# Patient Record
Sex: Female | Born: 1945 | Race: White | Hispanic: No | Smoking: Former smoker
Health system: Southern US, Community
[De-identification: ages and names within clinical notes are randomized; demographics above are authoritative.]

## PROBLEM LIST (undated history)

## (undated) DIAGNOSIS — G4700 Insomnia, unspecified: Secondary | ICD-10-CM

## (undated) DIAGNOSIS — K219 Gastro-esophageal reflux disease without esophagitis: Secondary | ICD-10-CM

## (undated) DIAGNOSIS — N904 Leukoplakia of vulva: Secondary | ICD-10-CM

## (undated) DIAGNOSIS — T7840XA Allergy, unspecified, initial encounter: Secondary | ICD-10-CM

## (undated) DIAGNOSIS — K649 Unspecified hemorrhoids: Secondary | ICD-10-CM

## (undated) HISTORY — DX: Unspecified hemorrhoids: K64.9

## (undated) HISTORY — DX: Leukoplakia of vulva: N90.4

## (undated) HISTORY — DX: Allergy, unspecified, initial encounter: T78.40XA

## (undated) HISTORY — DX: Gastro-esophageal reflux disease without esophagitis: K21.9

## (undated) HISTORY — DX: Insomnia, unspecified: G47.00

---

## 1998-01-03 ENCOUNTER — Other Ambulatory Visit: Admission: RE | Admit: 1998-01-03 | Discharge: 1998-01-03 | Payer: Self-pay | Admitting: Obstetrics and Gynecology

## 1999-06-26 ENCOUNTER — Other Ambulatory Visit: Admission: RE | Admit: 1999-06-26 | Discharge: 1999-06-26 | Payer: Self-pay | Admitting: Obstetrics and Gynecology

## 1999-07-25 ENCOUNTER — Encounter: Payer: Self-pay | Admitting: Obstetrics and Gynecology

## 1999-07-25 ENCOUNTER — Encounter: Admission: RE | Admit: 1999-07-25 | Discharge: 1999-07-25 | Payer: Self-pay | Admitting: Obstetrics and Gynecology

## 2000-06-26 ENCOUNTER — Other Ambulatory Visit: Admission: RE | Admit: 2000-06-26 | Discharge: 2000-06-26 | Payer: Self-pay | Admitting: Obstetrics and Gynecology

## 2000-07-26 ENCOUNTER — Encounter: Payer: Self-pay | Admitting: Obstetrics and Gynecology

## 2000-07-26 ENCOUNTER — Encounter: Admission: RE | Admit: 2000-07-26 | Discharge: 2000-07-26 | Payer: Self-pay | Admitting: Obstetrics and Gynecology

## 2000-10-16 ENCOUNTER — Ambulatory Visit (HOSPITAL_BASED_OUTPATIENT_CLINIC_OR_DEPARTMENT_OTHER): Admission: RE | Admit: 2000-10-16 | Discharge: 2000-10-16 | Payer: Self-pay | Admitting: Orthopedic Surgery

## 2001-10-15 ENCOUNTER — Other Ambulatory Visit: Admission: RE | Admit: 2001-10-15 | Discharge: 2001-10-15 | Payer: Self-pay | Admitting: Obstetrics and Gynecology

## 2001-11-26 ENCOUNTER — Encounter: Admission: RE | Admit: 2001-11-26 | Discharge: 2001-11-26 | Payer: Self-pay | Admitting: Obstetrics and Gynecology

## 2001-11-26 ENCOUNTER — Encounter: Payer: Self-pay | Admitting: Obstetrics and Gynecology

## 2002-04-21 ENCOUNTER — Encounter: Payer: Self-pay | Admitting: Gastroenterology

## 2002-04-21 ENCOUNTER — Encounter: Admission: RE | Admit: 2002-04-21 | Discharge: 2002-04-21 | Payer: Self-pay | Admitting: Gastroenterology

## 2002-11-30 ENCOUNTER — Other Ambulatory Visit: Admission: RE | Admit: 2002-11-30 | Discharge: 2002-11-30 | Payer: Self-pay | Admitting: Obstetrics and Gynecology

## 2002-12-01 ENCOUNTER — Encounter: Payer: Self-pay | Admitting: Obstetrics and Gynecology

## 2002-12-01 ENCOUNTER — Encounter: Admission: RE | Admit: 2002-12-01 | Discharge: 2002-12-01 | Payer: Self-pay | Admitting: Obstetrics and Gynecology

## 2003-12-07 ENCOUNTER — Ambulatory Visit (HOSPITAL_COMMUNITY): Admission: RE | Admit: 2003-12-07 | Discharge: 2003-12-07 | Payer: Self-pay | Admitting: Obstetrics and Gynecology

## 2004-12-18 ENCOUNTER — Ambulatory Visit (HOSPITAL_COMMUNITY): Admission: RE | Admit: 2004-12-18 | Discharge: 2004-12-18 | Payer: Self-pay | Admitting: Obstetrics and Gynecology

## 2005-12-19 ENCOUNTER — Ambulatory Visit (HOSPITAL_COMMUNITY): Admission: RE | Admit: 2005-12-19 | Discharge: 2005-12-19 | Payer: Self-pay | Admitting: Obstetrics and Gynecology

## 2006-12-23 ENCOUNTER — Ambulatory Visit (HOSPITAL_COMMUNITY): Admission: RE | Admit: 2006-12-23 | Discharge: 2006-12-23 | Payer: Self-pay | Admitting: Obstetrics and Gynecology

## 2008-01-01 ENCOUNTER — Ambulatory Visit (HOSPITAL_COMMUNITY): Admission: RE | Admit: 2008-01-01 | Discharge: 2008-01-01 | Payer: Self-pay | Admitting: Obstetrics and Gynecology

## 2009-01-04 ENCOUNTER — Ambulatory Visit (HOSPITAL_COMMUNITY): Admission: RE | Admit: 2009-01-04 | Discharge: 2009-01-04 | Payer: Self-pay | Admitting: Obstetrics and Gynecology

## 2010-01-06 ENCOUNTER — Ambulatory Visit (HOSPITAL_COMMUNITY): Admission: RE | Admit: 2010-01-06 | Discharge: 2010-01-06 | Payer: Self-pay | Admitting: Obstetrics and Gynecology

## 2010-01-11 ENCOUNTER — Encounter: Admission: RE | Admit: 2010-01-11 | Discharge: 2010-01-11 | Payer: Self-pay | Admitting: Obstetrics and Gynecology

## 2010-03-06 ENCOUNTER — Encounter: Admission: RE | Admit: 2010-03-06 | Discharge: 2010-03-06 | Payer: Self-pay | Admitting: Family Medicine

## 2010-07-02 ENCOUNTER — Encounter: Payer: Self-pay | Admitting: Obstetrics and Gynecology

## 2010-10-27 NOTE — Op Note (Signed)
[facility]  Patient:    Brandy Leonard, Brandy Leonard                      MRN: [id number] Proc. Date: 10/16/00 Adm. Date:  [id number] Attending:  Marlowe Shores                           Operative Report  PREOPERATIVE DIAGNOSIS:  Right thumb stenosing tenosynovitis.  POSTOPERATIVE DIAGNOSIS:  Right thumb stenosing tenosynovitis.  OPERATION:  Release of A1 pulley, right thumb.  SURGEON:  Artist Pais. Mina Marble, M.D.  ASSISTANT:  Junius Roads. Ireton, P.A.C.  ANESTHESIA:  General  TOURNIQUET TIME:  18 minutes  COMPLICATIONS:  None.  DRAINS: None.  DESCRIPTION OF PROCEDURE:  The patient was taken to the operating room and after the induction of adequate general anesthesia, the right upper extremity was prepped and draped in the usual sterile fashion.  An Esmarch was used to exsanguinate the limb.  The tourniquet was then inflated to 250 mmHg.  At this point in time, a 2 cm incision was made in the palmar aspect of the thumb in the MP flexion crease.  The incision was taken down through the skin and subcutaneous tissues with care to carefully identify and retract both the radial and ulnar neurovascular bundles.  Once this was done, the proximal edge of the A1 pulley was identified.  This was split with a tenotomy scissors thus exposing the underlying FPL tendon. The FPL tendon was then lysed of all adhesions and the patient had full passive range of motion with no after triggering noted.  The wound was then thoroughly irrigated and closed with 5-0 nylon in horizontal mattress sutures.  A sterile dressing of Xeroform, 4 x 4s, fluffs and compressive dressing was applied.  The patient also had 0.25% plain Marcaine injected for postoperative pain control. DD:  10/16/00 TD:  10/16/00 Job: [id number]

## 2010-12-20 ENCOUNTER — Other Ambulatory Visit (HOSPITAL_COMMUNITY): Payer: Self-pay | Admitting: Obstetrics and Gynecology

## 2010-12-20 DIAGNOSIS — Z1231 Encounter for screening mammogram for malignant neoplasm of breast: Secondary | ICD-10-CM

## 2011-01-16 ENCOUNTER — Ambulatory Visit (HOSPITAL_COMMUNITY)
Admission: RE | Admit: 2011-01-16 | Discharge: 2011-01-16 | Disposition: A | Discharge status: Home / Self Care | Payer: BC Managed Care – PPO | Source: Ambulatory Visit | Attending: Obstetrics and Gynecology | Admitting: Obstetrics and Gynecology

## 2011-01-16 DIAGNOSIS — Z1231 Encounter for screening mammogram for malignant neoplasm of breast: Secondary | ICD-10-CM | POA: Insufficient documentation

## 2011-05-24 ENCOUNTER — Encounter (INDEPENDENT_AMBULATORY_CARE_PROVIDER_SITE_OTHER): Payer: BC Managed Care – PPO | Admitting: Family Medicine

## 2011-05-24 DIAGNOSIS — D509 Iron deficiency anemia, unspecified: Secondary | ICD-10-CM

## 2011-05-24 DIAGNOSIS — Z23 Encounter for immunization: Secondary | ICD-10-CM

## 2011-05-24 DIAGNOSIS — Z0000 Encounter for general adult medical examination without abnormal findings: Secondary | ICD-10-CM

## 2011-07-02 ENCOUNTER — Other Ambulatory Visit (HOSPITAL_COMMUNITY): Payer: Self-pay | Admitting: Obstetrics and Gynecology

## 2011-07-02 DIAGNOSIS — M8580 Other specified disorders of bone density and structure, unspecified site: Secondary | ICD-10-CM

## 2011-07-04 ENCOUNTER — Ambulatory Visit (HOSPITAL_COMMUNITY): Payer: BC Managed Care – PPO

## 2011-07-10 ENCOUNTER — Ambulatory Visit (HOSPITAL_COMMUNITY)
Admission: RE | Admit: 2011-07-10 | Discharge: 2011-07-10 | Disposition: A | Discharge status: Home / Self Care | Payer: BC Managed Care – PPO | Source: Ambulatory Visit | Attending: Obstetrics and Gynecology | Admitting: Obstetrics and Gynecology

## 2011-07-10 DIAGNOSIS — M8580 Other specified disorders of bone density and structure, unspecified site: Secondary | ICD-10-CM

## 2011-07-10 DIAGNOSIS — Z13820 Encounter for screening for osteoporosis: Secondary | ICD-10-CM | POA: Insufficient documentation

## 2011-07-10 DIAGNOSIS — Z780 Asymptomatic menopausal state: Secondary | ICD-10-CM | POA: Insufficient documentation

## 2011-07-19 ENCOUNTER — Ambulatory Visit (INDEPENDENT_AMBULATORY_CARE_PROVIDER_SITE_OTHER): Payer: BC Managed Care – PPO | Admitting: Internal Medicine

## 2011-07-19 VITALS — BP 108/72 | HR 80 | Temp 98.3°F | Resp 16 | Ht 64.5 in | Wt 158.0 lb

## 2011-07-19 DIAGNOSIS — R300 Dysuria: Secondary | ICD-10-CM

## 2011-07-19 LAB — POCT UA - MICROSCOPIC ONLY
Bacteria, U Microscopic: NEGATIVE
Casts, Ur, LPF, POC: NEGATIVE
Crystals, Ur, HPF, POC: NEGATIVE
Mucus, UA: POSITIVE
Yeast, UA: NEGATIVE

## 2011-07-19 LAB — POCT URINALYSIS DIPSTICK
Bilirubin, UA: NEGATIVE
Blood, UA: NEGATIVE
Glucose, UA: NEGATIVE
Leukocytes, UA: NEGATIVE
Nitrite, UA: NEGATIVE
Protein, UA: NEGATIVE
Spec Grav, UA: 1.025
Urobilinogen, UA: 0.2
pH, UA: 6.5

## 2011-07-19 MED ORDER — CIPROFLOXACIN HCL 250 MG PO TABS: 250.0000 mg | ORAL_TABLET | Freq: Two times a day (BID) | ORAL | Status: AC

## 2011-07-19 NOTE — Progress Notes (Signed)
  Subjective:    Patient ID: Brandy Leonard, female    DOB: 12-May-1946, 66 y.o.   MRN: [id number]  Dysuria  This is a new problem. The current episode started yesterday. The problem occurs every urination. The problem has been unchanged. The quality of the pain is described as aching. The pain is moderate. There has been no fever. There is no history of pyelonephritis. Associated symptoms include frequency and urgency. Pertinent negatives include no chills, flank pain, hematuria, nausea or vomiting. She has tried nothing for the symptoms.      Review of Systems  Constitutional: Negative.  Negative for chills.  HENT: Negative.   Cardiovascular: Negative.   Gastrointestinal: Negative.  Negative for nausea and vomiting.  Genitourinary: Positive for dysuria, urgency and frequency. Negative for hematuria and flank pain.  Neurological: Negative.   Psychiatric/Behavioral: Negative.        Objective:   Physical Exam  Constitutional: She is oriented to person, place, and time. She appears well-developed and well-nourished.  HENT:  Head: Normocephalic.  Eyes: Conjunctivae are normal.  Neck: Neck supple.  Cardiovascular: Normal rate, regular rhythm and normal heart sounds.   Pulmonary/Chest: Effort normal and breath sounds normal.  Abdominal: Soft. There is no tenderness.  Neurological: She is alert and oriented to person, place, and time.  Skin: Skin is warm and dry.  No flank pain, pt appears comfortable.        Assessment & Plan:

## 2011-07-19 NOTE — Patient Instructions (Signed)
Urine today does not show any signs of infection.  I will start you on antibiotic while we await for UC results.  Please drink lots of fluids! Urinary Tract Infection Infections of the urinary tract can start in several places. A bladder infection (cystitis), a kidney infection (pyelonephritis), and a prostate infection (prostatitis) are different types of urinary tract infections (UTIs). They usually get better if treated with medicines (antibiotics) that kill germs. Take all the medicine until it is gone. You or your child may feel better in a few days, but TAKE ALL MEDICINE or the infection may not respond and may become more difficult to treat. HOME CARE INSTRUCTIONS   Drink enough water and fluids to keep the urine clear or pale yellow. Cranberry juice is especially recommended, in addition to large amounts of water.   Avoid caffeine, tea, and carbonated beverages. They tend to irritate the bladder.   Alcohol may irritate the prostate.   Only take over-the-counter or prescription medicines for pain, discomfort, or fever as directed by your caregiver.  To prevent further infections:  Empty the bladder often. Avoid holding urine for long periods of time.   After a bowel movement, women should cleanse from front to back. Use each tissue only once.   Empty the bladder before and after sexual intercourse.  FINDING OUT THE RESULTS OF YOUR TEST Not all test results are available during your visit. If your or your child's test results are not back during the visit, make an appointment with your caregiver to find out the results. Do not assume everything is normal if you have not heard from your caregiver or the medical facility. It is important for you to follow up on all test results. SEEK MEDICAL CARE IF:   There is back pain.   Your baby is older than 3 months with a rectal temperature of 100.5 F (38.1 C) or higher for more than 1 day.   Your or your child's problems (symptoms) are no  better in 3 days. Return sooner if you or your child is getting worse.  SEEK IMMEDIATE MEDICAL CARE IF:   There is severe back pain or lower abdominal pain.   You or your child develops chills.   You have a fever.   Your baby is older than 3 months with a rectal temperature of 102 F (38.9 C) or higher.   Your baby is 35 months old or younger with a rectal temperature of 100.4 F (38 C) or higher.   There is nausea or vomiting.   There is continued burning or discomfort with urination.  MAKE SURE YOU:   Understand these instructions.   Will watch your condition.   Will get help right away if you are not doing well or get worse.  Document Released: 03/07/2005 Document Revised: 02/07/2011 Document Reviewed: 10/10/2006 [facility] Patient Information 2012 [place], [place].

## 2011-07-21 LAB — URINE CULTURE
Colony Count: NO GROWTH
Organism ID, Bacteria: NO GROWTH

## 2011-11-28 ENCOUNTER — Other Ambulatory Visit: Payer: Self-pay

## 2011-11-28 MED ORDER — LORAZEPAM 0.5 MG PO TABS: 0.5000 mg | ORAL_TABLET | Freq: Every day | ORAL | Status: DC

## 2011-11-28 NOTE — Telephone Encounter (Signed)
Brandy Leonard at [facility] states her attempt to e-scribe script failed numerous times.   LORazepam (ATIVAN) 0.5 MG tablet [facility] [address] last filled 3/25  90 day supply

## 2011-11-28 NOTE — Telephone Encounter (Signed)
LMOM that rx was at front desk for pick up.

## 2011-11-28 NOTE — Telephone Encounter (Signed)
rx printed

## 2011-11-30 ENCOUNTER — Other Ambulatory Visit: Payer: Self-pay

## 2011-11-30 NOTE — Telephone Encounter (Signed)
Patient calling to check the status of rx refill request faxed from her pharmacy Wednesday. She states she needs her refill today.

## 2011-12-03 ENCOUNTER — Telehealth: Payer: Self-pay

## 2011-12-03 NOTE — Telephone Encounter (Signed)
Spoke with pharmacy. They had not received rx that was sent 6/19 so I called it in today, 6/24

## 2011-12-03 NOTE — Telephone Encounter (Signed)
PT STATES THE HER PHARMACY, [facility] [address] [facility] [address], HAS MADE 3 REQUEST FOR A REFILL ON ADIVAN. PT STATES SHE IS OUT AND NEEDS REFILL NOW. PLEASE CALL PT TO ADVISE

## 2011-12-20 ENCOUNTER — Other Ambulatory Visit (HOSPITAL_COMMUNITY): Payer: Self-pay | Admitting: Obstetrics and Gynecology

## 2011-12-20 DIAGNOSIS — Z1231 Encounter for screening mammogram for malignant neoplasm of breast: Secondary | ICD-10-CM

## 2012-01-18 ENCOUNTER — Ambulatory Visit (HOSPITAL_COMMUNITY)
Admission: RE | Admit: 2012-01-18 | Discharge: 2012-01-18 | Disposition: A | Discharge status: Home / Self Care | Payer: BC Managed Care – PPO | Source: Ambulatory Visit | Attending: Obstetrics and Gynecology | Admitting: Obstetrics and Gynecology

## 2012-01-18 DIAGNOSIS — Z1231 Encounter for screening mammogram for malignant neoplasm of breast: Secondary | ICD-10-CM | POA: Insufficient documentation

## 2012-02-26 ENCOUNTER — Ambulatory Visit (INDEPENDENT_AMBULATORY_CARE_PROVIDER_SITE_OTHER): Payer: BC Managed Care – PPO | Admitting: Family Medicine

## 2012-02-26 ENCOUNTER — Encounter: Payer: Self-pay | Admitting: Family Medicine

## 2012-02-26 VITALS — BP 120/84 | HR 78 | Temp 98.1°F | Resp 16 | Ht 64.5 in | Wt 158.4 lb

## 2012-02-26 DIAGNOSIS — G4700 Insomnia, unspecified: Secondary | ICD-10-CM | POA: Insufficient documentation

## 2012-02-26 MED ORDER — LORAZEPAM 0.5 MG PO TABS: 0.5000 mg | ORAL_TABLET | Freq: Every day | ORAL | Status: DC

## 2012-02-26 NOTE — Progress Notes (Signed)
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 She's been very busy lately. She's been on lorazepam chronically for insomnia and still working well for her.  Objective: No acute distress patient's cheerful.  Chest: Clear Heart: Regular no murmur HEENT: Unremarkable  Assessment: Doing well on lorazepam 1. Insomnia

## 2012-07-07 ENCOUNTER — Ambulatory Visit (INDEPENDENT_AMBULATORY_CARE_PROVIDER_SITE_OTHER): Payer: BC Managed Care – PPO | Admitting: Internal Medicine

## 2012-07-07 VITALS — BP 126/79 | HR 100 | Temp 98.4°F | Resp 16 | Ht 64.0 in | Wt 160.0 lb

## 2012-07-07 DIAGNOSIS — H02849 Edema of unspecified eye, unspecified eyelid: Secondary | ICD-10-CM

## 2012-07-07 DIAGNOSIS — F419 Anxiety disorder, unspecified: Secondary | ICD-10-CM

## 2012-07-07 DIAGNOSIS — J029 Acute pharyngitis, unspecified: Secondary | ICD-10-CM

## 2012-07-07 DIAGNOSIS — H00019 Hordeolum externum unspecified eye, unspecified eyelid: Secondary | ICD-10-CM

## 2012-07-07 MED ORDER — AZITHROMYCIN 500 MG PO TABS: 500.0000 mg | ORAL_TABLET | Freq: Every day | ORAL | Status: DC

## 2012-07-07 MED ORDER — LORAZEPAM 0.5 MG PO TABS: 0.5000 mg | ORAL_TABLET | Freq: Every day | ORAL | Status: DC

## 2012-07-07 NOTE — Progress Notes (Signed)
  Subjective:    Patient ID: Brandy Leonard, female    DOB: 1945/08/12, 67 y.o.   MRN: [id number]  HPI Sore throat Left ear ache Stye right eye Swelling left upper eyelid Onset several days ago Symptoms mild in severity no fever but has chills no nausea or vomiting has a cough from previous infection several weeks ago Had the flu shot  Is planning to travel by air in the next week.    Review of Systems  Constitutional: Positive for fatigue.  HENT: Positive for ear pain and congestion. Negative for ear discharge.   Eyes: Positive for pain.  Respiratory: Positive for cough.   Cardiovascular: Negative.   Genitourinary: Negative.   Musculoskeletal: Negative.   Skin: Negative.   Neurological: Negative.   Hematological: Negative.   Psychiatric/Behavioral: Negative.   All other systems reviewed and are negative.       Objective:   Physical Exam  Nursing note and vitals reviewed. Constitutional: She is oriented to person, place, and time. She appears well-developed and well-nourished.  HENT:  Head: Normocephalic.  Right Ear: External ear normal.  Nose: Nose normal.  Mouth/Throat: Oropharynx is clear and moist.       l ear with increased fluid decreased light reflex Throat injected no exudate  Eyes: Conjunctivae normal and EOM are normal. Pupils are equal, round, and reactive to light.       Right eye stye, left lid slight swelling  Neck: Neck supple.  Cardiovascular: Normal rate, regular rhythm and normal heart sounds.   Pulmonary/Chest: Effort normal and breath sounds normal.  Abdominal: Soft. Bowel sounds are normal.  Musculoskeletal: Normal range of motion.  Neurological: She is alert and oriented to person, place, and time. She has normal reflexes.  Skin: Skin is warm and dry.  Psychiatric: She has a normal mood and affect. Her behavior is normal. Judgment and thought content normal.          Assessment & Plan:  Pharyngitis Left otitis media Stye right eye

## 2012-07-07 NOTE — Patient Instructions (Signed)
Take meds as directed. Return in 3 days if no improvement. Warm compresses to right eye 3 times daily.

## 2012-09-08 ENCOUNTER — Ambulatory Visit (INDEPENDENT_AMBULATORY_CARE_PROVIDER_SITE_OTHER): Payer: BC Managed Care – PPO | Admitting: Emergency Medicine

## 2012-09-08 VITALS — BP 132/88 | HR 114 | Temp 98.7°F | Resp 16 | Ht 64.25 in | Wt 160.4 lb

## 2012-09-08 DIAGNOSIS — M940 Chondrocostal junction syndrome [Tietze]: Secondary | ICD-10-CM

## 2012-09-08 MED ORDER — NAPROXEN SODIUM 550 MG PO TABS: 550.0000 mg | ORAL_TABLET | Freq: Two times a day (BID) | ORAL | Status: DC

## 2012-09-08 MED ORDER — HYDROCODONE-ACETAMINOPHEN 5-325 MG PO TABS: 1.0000 | ORAL_TABLET | ORAL | Status: DC | PRN

## 2012-09-08 NOTE — Patient Instructions (Addendum)
Costochondritis Costochondritis (Tietze syndrome), or costochondral separation, is a swelling and irritation (inflammation) of the tissue (cartilage) that connects your ribs with your breastbone (sternum). It may occur on its own (spontaneously), through damage caused by an accident (trauma), or simply from coughing or minor exercise. It may take up to 6 weeks to get better and longer if you are unable to be conservative in your activities. HOME CARE INSTRUCTIONS   Avoid exhausting physical activity. Try not to strain your ribs during normal activity. This would include any activities using chest, belly (abdominal), and side muscles, especially if heavy weights are used.  Use ice for 15 to 20 minutes per hour while awake for the first 2 days. Place the ice in a plastic bag, and place a towel between the bag of ice and your skin.  Only take over-the-counter or prescription medicines for pain, discomfort, or fever as directed by your caregiver. SEEK IMMEDIATE MEDICAL CARE IF:   Your pain increases or you are very uncomfortable.  You have a fever.  You develop difficulty with your breathing.  You cough up blood.  You develop worse chest pains, shortness of breath, sweating, or vomiting.  You develop new, unexplained problems (symptoms). MAKE SURE YOU:   Understand these instructions.  Will watch your condition.  Will get help right away if you are not doing well or get worse. Document Released: 03/07/2005 Document Revised: 08/20/2011 Document Reviewed: 01/14/2008 [employer] Patient Information 2013 [employer].  

## 2012-09-08 NOTE — Progress Notes (Signed)
[facility] [address] [place] [address] [phone]  Date:  09/08/2012   Name:  Brandy Leonard   DOB:  March 01, 1946   MRN:  [id number]  PCP:  No PCP Per Patient    Chief Complaint: Flank Pain   History of Present Illness:  Brandy Leonard is a 67 y.o. very pleasant female patient who presents with the following:  Pain in left inferior anterior chest that is pleuritic in nature.  Denies antecedent illness, injury, or overuse.  Denies fever or chills.  No rash or other complaints.  No improvement with over the counter medications or other home remedies. Denies other complaint or health concern today.   Patient Active Problem List  Diagnosis  . Insomnia    Past Medical History  Diagnosis Date  . Allergy     History reviewed. No pertinent past surgical history.  History  Substance Use Topics  . Smoking status: Former Games developer  . Smokeless tobacco: Not on file  . Alcohol Use: 1.2 oz/week    2 Glasses of wine per week    Family History  Problem Relation Age of Onset  . Hypertension Mother   . Thyroid disease Father   . Hypertension Brother   . Stroke Maternal Grandmother   . Cancer Maternal Grandfather     No Known Allergies  Medication list has been reviewed and updated.  Current Outpatient Prescriptions on File Prior to Visit  Medication Sig Dispense Refill  . azithromycin (ZITHROMAX) 500 MG tablet Take 1 tablet (500 mg total) by mouth daily.  5 tablet  0  . LORazepam (ATIVAN) 0.5 MG tablet Take 1 tablet (0.5 mg total) by mouth at bedtime.  90 tablet  1   No current facility-administered medications on file prior to visit.    Review of Systems:  As per HPI, otherwise negative.    Physical Examination: Filed Vitals:   09/08/12 1503  BP: 132/88  Pulse: 114  Temp: 98.7 F (37.1 C)  Resp: 16   Filed Vitals:   09/08/12 1503  Height: 5' 4.25" (1.632 m)  Weight: 160 lb 6.4 oz (72.757 kg)   Body mass index is 27.32  kg/(m^2). Ideal Body Weight: Weight in (lb) to have BMI = 25: 146.5   GEN: WDWN, NAD, Non-toxic, Alert & Oriented x 3 HEENT: Atraumatic, Normocephalic.  Ears and Nose: No external deformity. EXTR: No clubbing/cyanosis/edema NEURO: Normal gait.  PSYCH: Normally interactive. Conversant. Not depressed or anxious appearing.  Calm demeanor.  CHEST:  No rash, ecchymosis, no rub, crepitus, chest clear.  BS = lungs clear.  No flail. Tender localized area anteriorly in inferior chest wall  Assessment and Plan: Costochondritis Anaprox vicodin   Signed,  Phillips Odor, MD

## 2013-02-17 ENCOUNTER — Other Ambulatory Visit: Payer: Self-pay | Admitting: Internal Medicine

## 2013-02-19 ENCOUNTER — Other Ambulatory Visit: Payer: Self-pay | Admitting: Family Medicine

## 2013-02-19 ENCOUNTER — Other Ambulatory Visit: Payer: Self-pay | Admitting: Internal Medicine

## 2013-02-19 NOTE — Telephone Encounter (Signed)
Do you want to deny or give 1 mos w/note to RTC?

## 2013-02-20 ENCOUNTER — Telehealth: Payer: Self-pay

## 2013-02-20 MED ORDER — HYDROCORTISONE ACETATE 25 MG RE SUPP: 25.0000 mg | Freq: Every day | RECTAL | Status: DC

## 2013-02-20 NOTE — Addendum Note (Signed)
Addended byCaffie Damme on: 02/20/2013 05:00 PM   Modules accepted: Orders

## 2013-02-20 NOTE — Telephone Encounter (Signed)
Pt states that she has been waiting for a refill on her hydrocortizone suppository and her lorazepam since Monday but the pharmacy states that they have not received anything from [place]. Best#[phone]

## 2013-02-20 NOTE — Telephone Encounter (Signed)
pts Lorazepam was approved yesterday, please advise on suppository.

## 2013-02-20 NOTE — Telephone Encounter (Signed)
Okay to refill anusol HC suppositories #30 1 daily prn

## 2013-02-20 NOTE — Telephone Encounter (Signed)
Sent in

## 2013-03-16 ENCOUNTER — Other Ambulatory Visit (HOSPITAL_COMMUNITY): Payer: Self-pay | Admitting: Obstetrics and Gynecology

## 2013-03-16 DIAGNOSIS — Z1231 Encounter for screening mammogram for malignant neoplasm of breast: Secondary | ICD-10-CM

## 2013-03-24 ENCOUNTER — Ambulatory Visit (HOSPITAL_COMMUNITY)
Admission: RE | Admit: 2013-03-24 | Discharge: 2013-03-24 | Disposition: A | Discharge status: Home / Self Care | Payer: Medicare Other | Source: Ambulatory Visit | Attending: Obstetrics and Gynecology | Admitting: Obstetrics and Gynecology

## 2013-03-24 DIAGNOSIS — Z1231 Encounter for screening mammogram for malignant neoplasm of breast: Secondary | ICD-10-CM | POA: Diagnosis not present

## 2013-03-26 DIAGNOSIS — K5730 Diverticulosis of large intestine without perforation or abscess without bleeding: Secondary | ICD-10-CM | POA: Diagnosis not present

## 2013-03-26 DIAGNOSIS — Z800 Family history of malignant neoplasm of digestive organs: Secondary | ICD-10-CM | POA: Diagnosis not present

## 2013-03-26 DIAGNOSIS — L290 Pruritus ani: Secondary | ICD-10-CM | POA: Diagnosis not present

## 2013-03-26 DIAGNOSIS — Z1211 Encounter for screening for malignant neoplasm of colon: Secondary | ICD-10-CM | POA: Diagnosis not present

## 2013-04-08 ENCOUNTER — Ambulatory Visit (INDEPENDENT_AMBULATORY_CARE_PROVIDER_SITE_OTHER): Payer: Medicare Other | Admitting: Family Medicine

## 2013-04-08 VITALS — BP 122/74 | HR 97 | Temp 98.4°F | Resp 17 | Ht 64.5 in | Wt 160.0 lb

## 2013-04-08 DIAGNOSIS — R0982 Postnasal drip: Secondary | ICD-10-CM | POA: Diagnosis not present

## 2013-04-08 DIAGNOSIS — R0981 Nasal congestion: Secondary | ICD-10-CM

## 2013-04-08 DIAGNOSIS — J3489 Other specified disorders of nose and nasal sinuses: Secondary | ICD-10-CM | POA: Diagnosis not present

## 2013-04-08 DIAGNOSIS — J029 Acute pharyngitis, unspecified: Secondary | ICD-10-CM

## 2013-04-08 MED ORDER — IPRATROPIUM BROMIDE 0.03 % NA SOLN: 2.0000 | Freq: Four times a day (QID) | NASAL | Status: DC

## 2013-04-08 MED ORDER — AZITHROMYCIN 250 MG PO TABS: ORAL_TABLET | ORAL | Status: DC

## 2013-04-08 NOTE — Patient Instructions (Signed)
Use the atrovent nasal spray as needed for post- nasal drainage.   Use the azithromycin pack if needed.  If you are not getting better or have any other concerns please let me know.

## 2013-04-08 NOTE — Progress Notes (Signed)
[facility] [address], [place] [address] [phone]  Date:  04/08/2013   Name:  Brandy Leonard   DOB:  08/08/1945   MRN:  [id number]  PCP:  No PCP Per Patient    Chief Complaint: Sinusitis, Sore Throat, Otalgia and Nasal Congestion   History of Present Illness:  Brandy Leonard is a 67 y.o. very pleasant female patient who presents with the following:  She is here today with concern about a "sinus infection", ST, and drainage.  Her ears and neck hurt.  "My whole head hurts."  She has been ill for 3 or 4 days.  She has tried some OTC medications and thought she was getting better, but then got worse again today  "I get these sometimes.  A z-pack will get it better."  She has not noted a fever.  Besides her neck she does not note any aches, no chills.   No GI symptoms.   She has a mild cough only.   No sick contacts that she is aware of.   She is generally healthy.    Patient Active Problem List   Diagnosis Date Noted  . Insomnia 02/26/2012    Past Medical History  Diagnosis Date  . Allergy     No past surgical history on file.  History  Substance Use Topics  . Smoking status: Former Games developer  . Smokeless tobacco: Not on file  . Alcohol Use: 1.2 oz/week    2 Glasses of wine per week    Family History  Problem Relation Age of Onset  . Hypertension Mother   . Thyroid disease Father   . Hypertension Brother   . Stroke Maternal Grandmother   . Cancer Maternal Grandfather     No Known Allergies  Medication list has been reviewed and updated.  Current Outpatient Prescriptions on File Prior to Visit  Medication Sig Dispense Refill  . LORazepam (ATIVAN) 0.5 MG tablet TAKE 1 TABLET BY MOUTH AT BEDTIME  90 tablet  1  . azithromycin (ZITHROMAX) 500 MG tablet Take 1 tablet (500 mg total) by mouth daily.  5 tablet  0  . HYDROcodone-acetaminophen (NORCO) 5-325 MG per tablet Take 1 tablet by mouth every 4 (four) hours as needed.  30 tablet   0  . hydrocortisone (ANUSOL-HC) 25 MG suppository Place 1 suppository (25 mg total) rectally daily. As needed  30 suppository  0  . naproxen sodium (ANAPROX DS) 550 MG tablet Take 1 tablet (550 mg total) by mouth 2 (two) times daily with a meal.  40 tablet  0   No current facility-administered medications on file prior to visit.    Review of Systems:  As per HPI- otherwise negative.    Physical Examination: Filed Vitals:   04/08/13 1508  BP: 122/74  Pulse: 97  Temp: 98.4 F (36.9 C)  Resp: 17   Filed Vitals:   04/08/13 1508  Height: 5' 4.5" (1.638 m)  Weight: 160 lb (72.576 kg)   Body mass index is 27.05 kg/(m^2). Ideal Body Weight: Weight in (lb) to have BMI = 25: 147.6  GEN: WDWN, NAD, Non-toxic, A & O x 3, looks well, well- groomed HEENT: Atraumatic, Normocephalic. Neck supple. No masses, No LAD.  Bilateral TM wnl, oropharynx normal.  PEERL,EOMI.   Nasal cavity is slightly inflamed.  No sinus tenderness  Ears and Nose: No external deformity. CV: RRR, No M/G/R. No JVD. No thrill. No extra heart sounds. PULM: CTA B, no  wheezes, crackles, rhonchi. No retractions. No resp. distress. No accessory muscle use. EXTR: No c/c/e NEURO Normal gait.  PSYCH: Normally interactive. Conversant. Not depressed or anxious appearing.  Calm demeanor.    Assessment and Plan: Sinus congestion - Plan: azithromycin (ZITHROMAX) 250 MG tablet  Pharyngitis - Plan: azithromycin (ZITHROMAX) 250 MG tablet  Post-nasal drip - Plan: ipratropium (ATROVENT) 0.03 % nasal spray  Discussed probably viral nature of her illness.  Suggested atrovent nasal for PND and resultant sore throat.  However if she is not better soon she can also fill and use the azithromycin rx.    Signed Abbe Amsterdam, MD

## 2013-04-29 ENCOUNTER — Ambulatory Visit (INDEPENDENT_AMBULATORY_CARE_PROVIDER_SITE_OTHER): Payer: Medicare Other | Admitting: Family Medicine

## 2013-04-29 ENCOUNTER — Ambulatory Visit: Payer: Medicare Other

## 2013-04-29 VITALS — BP 132/70 | HR 92 | Temp 97.5°F | Resp 16 | Ht 61.0 in | Wt 159.0 lb

## 2013-04-29 DIAGNOSIS — Z23 Encounter for immunization: Secondary | ICD-10-CM | POA: Diagnosis not present

## 2013-04-29 DIAGNOSIS — R059 Cough, unspecified: Secondary | ICD-10-CM | POA: Diagnosis not present

## 2013-04-29 DIAGNOSIS — J309 Allergic rhinitis, unspecified: Secondary | ICD-10-CM

## 2013-04-29 DIAGNOSIS — J209 Acute bronchitis, unspecified: Secondary | ICD-10-CM

## 2013-04-29 DIAGNOSIS — R05 Cough: Secondary | ICD-10-CM

## 2013-04-29 DIAGNOSIS — L309 Dermatitis, unspecified: Secondary | ICD-10-CM

## 2013-04-29 DIAGNOSIS — L259 Unspecified contact dermatitis, unspecified cause: Secondary | ICD-10-CM | POA: Diagnosis not present

## 2013-04-29 DIAGNOSIS — G4700 Insomnia, unspecified: Secondary | ICD-10-CM

## 2013-04-29 MED ORDER — LORAZEPAM 0.5 MG PO TABS: 0.5000 mg | ORAL_TABLET | Freq: Every day | ORAL | Status: DC

## 2013-04-29 MED ORDER — AMOXICILLIN-POT CLAVULANATE 875-125 MG PO TABS: 1.0000 | ORAL_TABLET | Freq: Two times a day (BID) | ORAL | Status: DC

## 2013-04-29 MED ORDER — BENZONATATE 100 MG PO CAPS: 100.0000 mg | ORAL_CAPSULE | Freq: Three times a day (TID) | ORAL | Status: DC | PRN

## 2013-04-29 MED ORDER — HYDROCORTISONE 1 % EX CREA: 1.0000 "application " | TOPICAL_CREAM | Freq: Two times a day (BID) | CUTANEOUS | Status: DC

## 2013-04-29 NOTE — Progress Notes (Signed)
[address]   [place], [place]  [address]   [phone] Subjective:    Patient ID: Brandy Leonard, female    DOB: [birth year], 67 y.o.   MRN: [id number]  This chart was scribed for Ethelda Chick, MD by Blanchard Kelch, ED Scribe. The patient was seen in room 12. Patient's care was started at 11:30 AM.   HPI  Brandy Leonard is a 67 y.o. female who presents to office complaining of a cough. She was seen here two weeks ago by Dr. Patsy Lager for a sinus infection, with sinus pressure and ear pain. She states that all of the symptoms subsided but she has had a persistent cough since then. She coughs all day and some during the night. She has not been coughing up much from her chest. She is also fatigued and has mild, thin rhinorrhea. She has tried a Z-pack, Mucinex for a few weeks, Robitussin, and other OTC medications without relief. She has had walking pneumonia once before and was concerned that she was getting it again. She denies a history of asthma. She denies fever, chills, headache, diaphoresis, sore throat, or congestion currently.   She is up to date on her colonoscopy. She would like to receive her flu and pneumonia vaccinations today.   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 She would also like to have her medications refilled.   Past Medical History  Diagnosis Date   Allergy    No past surgical history on file. Family History  Problem Relation Age of Onset   Hypertension Mother    Thyroid disease Father    Hypertension Brother    Stroke Maternal Grandmother    Cancer Maternal Grandfather    No Known Allergies Current Outpatient Prescriptions on File Prior to Visit  Medication Sig Dispense Refill   ipratropium (ATROVENT) 0.03 % nasal spray Place 2 sprays into the nose 4 (four) times daily. Use as needed for post- nasal drip  30 mL  6   LORazepam (ATIVAN) 0.5 MG tablet TAKE 1 TABLET BY MOUTH AT BEDTIME  90 tablet  1   azithromycin  (ZITHROMAX) 250 MG tablet Use as a zpack  6 each  0   hydrocortisone (ANUSOL-HC) 25 MG suppository Place 1 suppository (25 mg total) rectally daily. As needed  30 suppository  0   naproxen sodium (ANAPROX DS) 550 MG tablet Take 1 tablet (550 mg total) by mouth 2 (two) times daily with a meal.  40 tablet  0   No current facility-administered medications on file prior to visit.   History   Social History   Marital Status: Divorced    Spouse Name: N/A    Number of Children: N/A   Years of Education: N/A   Occupational History   Not on file.   Social History Main Topics   Smoking status: Former Smoker   Smokeless tobacco: Not on file   Alcohol Use: 1.2 oz/week    2 Glasses of wine per week   Drug Use: No   Sexual Activity: No   Other Topics Concern   Not on file   Social History Narrative   No narrative on file     Review of Systems  Constitutional: Positive for fatigue. Negative for fever, chills and diaphoresis.  HENT: Positive for rhinorrhea. Negative for congestion, sore throat and voice change.   Respiratory: Positive for cough. Negative for shortness of breath, wheezing and stridor.   Skin:  Positive for rash.  Neurological: Negative for headaches.  Psychiatric/Behavioral: Negative for suicidal ideas, sleep disturbance, self-injury and dysphoric mood. The patient is not nervous/anxious.        Objective:   Physical Exam  Constitutional: She is oriented to person, place, and time. She appears well-developed and well-nourished. No distress.  HENT:  Head: Normocephalic and atraumatic.  Right Ear: Tympanic membrane, external ear and ear canal normal.  Left Ear: Tympanic membrane, external ear and ear canal normal.  Nose: Nose normal.  Mouth/Throat: Oropharynx is clear and moist.  Some flaking in the ears present bilaterally.   Eyes: Conjunctivae and EOM are normal. Pupils are equal, round, and reactive to light.  Neck: Normal range of motion. Neck  supple. No tracheal deviation present. No thyromegaly present.  Cardiovascular: Normal rate, regular rhythm and normal heart sounds.   No murmur heard. Pulmonary/Chest: Effort normal and breath sounds normal. No respiratory distress. She has no wheezes. She has no rales.  Lymphadenopathy:    She has no cervical adenopathy.  Neurological: She is alert and oriented to person, place, and time. No cranial nerve deficit. Coordination normal.  Skin: Skin is warm and dry. Rash noted. She is not diaphoretic. No erythema.  Psychiatric: She has a normal mood and affect. Her behavior is normal.    [facility] reading (PRIMARY) by Dr. Katrinka Blazing: CXR:  No acute disease noted.       Assessment & Plan:  Cough - Plan: DG Chest 2 View  Allergic rhinitis, cause unspecified - Plan: DG Chest 2 View  Eczema - Plan: Flu Vaccine QUAD 36+ mos IM  Need for prophylactic vaccination and inoculation against influenza - Plan: Flu Vaccine QUAD 36+ mos IM  Need for prophylactic vaccination against Streptococcus pneumoniae (pneumococcus) - Plan: Pneumococcal polysaccharide vaccine 23-valent greater than or equal to 2yo subcutaneous/IM  Acute bronchitis  1.  Acute sinusitis/bronchitis:  Persistent; rx for Augmentin provided and Tessalon Perles.  Continue Mucinex PRN.   2.  Insomnia:  Stable; refill of Lorazepam provided. 3.  S/p flu vaccine and Pneumovax. 4. Eczema B ears:  New to this provider; chronic issue for patient; rx for hydrocortisone cream 1% provided.  Meds ordered this encounter  Medications   DISCONTD: LORazepam (ATIVAN) 0.5 MG tablet    Sig: Take 1 tablet (0.5 mg total) by mouth at bedtime.    Dispense:  30 tablet    Refill:  5   DISCONTD: amoxicillin-clavulanate (AUGMENTIN) 875-125 MG per tablet    Sig: Take 1 tablet by mouth 2 (two) times daily.    Dispense:  20 tablet    Refill:  0   amoxicillin-clavulanate (AUGMENTIN) 875-125 MG per tablet    Sig: Take 1 tablet by mouth 2 (two) times daily.     Dispense:  20 tablet    Refill:  0   benzonatate (TESSALON) 100 MG capsule    Sig: Take 1-2 capsules (100-200 mg total) by mouth 3 (three) times daily as needed for cough.    Dispense:  60 capsule    Refill:  0   hydrocortisone cream 1 %    Sig: Apply 1 application topically 2 (two) times daily.    Dispense:  60 g    Refill:  1    I personally performed the services described in this documentation, which was scribed in my presence.  The recorded information has been reviewed and is accurate.  Nilda Simmer, M.D.  [facility]   [place] [address]  [address] [place], [place]  [address] [phone] phone [phone] fax

## 2013-04-30 DIAGNOSIS — Z124 Encounter for screening for malignant neoplasm of cervix: Secondary | ICD-10-CM | POA: Diagnosis not present

## 2013-04-30 DIAGNOSIS — Z01419 Encounter for gynecological examination (general) (routine) without abnormal findings: Secondary | ICD-10-CM | POA: Diagnosis not present

## 2013-05-20 DIAGNOSIS — Z1211 Encounter for screening for malignant neoplasm of colon: Secondary | ICD-10-CM | POA: Diagnosis not present

## 2013-05-20 DIAGNOSIS — Z800 Family history of malignant neoplasm of digestive organs: Secondary | ICD-10-CM | POA: Diagnosis not present

## 2013-05-20 DIAGNOSIS — K5730 Diverticulosis of large intestine without perforation or abscess without bleeding: Secondary | ICD-10-CM | POA: Diagnosis not present

## 2013-10-22 ENCOUNTER — Ambulatory Visit (INDEPENDENT_AMBULATORY_CARE_PROVIDER_SITE_OTHER): Payer: Medicare Other | Admitting: Family Medicine

## 2013-10-22 VITALS — BP 122/76 | HR 89 | Temp 97.9°F | Resp 16 | Ht 64.0 in | Wt 168.4 lb

## 2013-10-22 DIAGNOSIS — J309 Allergic rhinitis, unspecified: Secondary | ICD-10-CM | POA: Diagnosis not present

## 2013-10-22 DIAGNOSIS — K648 Other hemorrhoids: Secondary | ICD-10-CM

## 2013-10-22 DIAGNOSIS — J3489 Other specified disorders of nose and nasal sinuses: Secondary | ICD-10-CM

## 2013-10-22 DIAGNOSIS — G4700 Insomnia, unspecified: Secondary | ICD-10-CM

## 2013-10-22 DIAGNOSIS — R0982 Postnasal drip: Secondary | ICD-10-CM

## 2013-10-22 MED ORDER — FLUTICASONE PROPIONATE 50 MCG/ACT NA SUSP: 1.0000 | Freq: Every day | NASAL | Status: DC

## 2013-10-22 MED ORDER — IPRATROPIUM BROMIDE 0.03 % NA SOLN: 2.0000 | Freq: Three times a day (TID) | NASAL | Status: DC | PRN

## 2013-10-22 MED ORDER — LORAZEPAM 0.5 MG PO TABS: 0.5000 mg | ORAL_TABLET | Freq: Every day | ORAL | Status: DC

## 2013-10-22 MED ORDER — HYDROCORTISONE ACETATE 25 MG RE SUPP: 25.0000 mg | Freq: Every day | RECTAL | Status: DC | PRN

## 2013-10-22 NOTE — Progress Notes (Addendum)
Subjective:   This chart was scribed for Brandy Ray, MD by Brandy Leonard, [facility] Scribe. This patient was seen in room 10 and the patient's care was started 12:17 PM.   Authored by Brandy Forehand, MD - unable to change in [facility].      Patient ID: Brandy Leonard, female    DOB: 03/13/1946, 68 y.o.   MRN: [id number]  HPI  HPI Comments: Brandy Leonard is a 68 y.o. female who presents to [facility] complaining of possible sinusitis x 3-4 days that is unchanged. She reports itchy eyes, bilateral eye drainage, sneezing, rhinorrhea, nasal congestion, HA, and mild swelling to the L cheek. Pt recently had dental work to the L side of her mouth and possibly attributes some of her symptoms to this. At this time she denies any fever. She was last seen October and November 2014 for sinus, congestion, and cough. She was treated with Augmentin and Tessalon pearls at 04/2013 visit.  She reports ongoing, constant insomnia. She is currently taking Ativan 0.5 mg qhs for this complaint which she has been taking for several years; She was given 5 refill on 04/29/2013 of this medication. She denies any fatigue, depression, or complications with this medication.  She is requesting a refill of Anusol-HC suppository for itchy internal hemorrhoids which she states she experiences about once a month. Pt last colonoscopy November 2014 without any complications. No recent changes in hemorrhoids. She denies any constipation or diet changes.  She is also requesting a new prescription of Atrovent nasal spray used for allergies as needed.  Pt has a history of vaginal Lichen Sclerosus and is requesting a refill prescription for a steroid cream.  She has no other pertinent past medical history. No other concerns this visit.  Patient Active Problem List   Diagnosis Date Noted   Insomnia 02/26/2012   Past Medical History  Diagnosis Date   Allergy    No past surgical  history on file. No Known Allergies Prior to Admission medications   Medication Sig Start Date End Date Taking? Authorizing Provider  amoxicillin-clavulanate (AUGMENTIN) 875-125 MG per tablet Take 1 tablet by mouth 2 (two) times daily. 04/29/13  Yes Brandy Honour, MD  benzonatate (TESSALON) 100 MG capsule Take 1-2 capsules (100-200 mg total) by mouth 3 (three) times daily as needed for cough. 04/29/13  Yes Brandy Honour, MD  hydrocortisone (ANUSOL-HC) 25 MG suppository Place 1 suppository (25 mg total) rectally daily. As needed 02/20/13  Yes Brandy Haber, MD  ipratropium (ATROVENT) 0.03 % nasal spray Place 2 sprays into the nose 4 (four) times daily. Use as needed for post- nasal drip 04/08/13  Yes Brandy C Copland, MD  LORazepam (ATIVAN) 0.5 MG tablet Take 1 tablet (0.5 mg total) by mouth at bedtime. 04/29/13  Yes Brandy Honour, MD  hydrocortisone cream 1 % Apply 1 application topically 2 (two) times daily. 04/29/13   Brandy Honour, MD   History   Social History   Marital Status: Divorced    Spouse Name: N/A    Number of Children: N/A   Years of Education: N/A   Occupational History   Not on file.   Social History Main Topics   Smoking status: Former Smoker   Smokeless tobacco: Not on file   Alcohol Use: 1.2 oz/week    2 Glasses of wine per week   Drug Use: No   Sexual Activity: No   Other Topics Concern  Not on file   Social History Narrative   No narrative on file     Review of Systems  Constitutional: Negative for fever and chills.  HENT: Positive for congestion, facial swelling (L sided), postnasal drip, rhinorrhea, sinus pressure and sneezing.   Eyes: Positive for discharge and itching.  Respiratory: Negative for cough and shortness of breath.   Skin: Negative for rash.  Neurological: Negative for weakness.  Psychiatric/Behavioral: Negative for confusion.      Objective:  Physical Exam  Vitals reviewed. Constitutional: She is oriented to  person, place, and time. She appears well-developed and well-nourished. No distress.  HENT:  Head: Normocephalic and atraumatic.  Right Ear: Hearing, tympanic membrane, external ear and ear canal normal.  Left Ear: Hearing, tympanic membrane, external ear and ear canal normal.  Nose: Mucosal edema present. Right sinus exhibits no maxillary sinus tenderness. Left sinus exhibits maxillary sinus tenderness.  Mouth/Throat: Oropharynx is clear and moist. No oropharyngeal exudate.  Eyes: Conjunctivae and EOM are normal. Pupils are equal, round, and reactive to light.  Cardiovascular: Normal rate, regular rhythm, normal heart sounds and intact distal pulses.   No murmur heard. Pulmonary/Chest: Effort normal and breath sounds normal. No respiratory distress. She has no wheezes. She has no rhonchi.  Neurological: She is alert and oriented to person, place, and time.  Skin: Skin is warm and dry. No rash noted.  Psychiatric: She has a normal mood and affect. Her behavior is normal.   Filed Vitals:   10/22/13 1125  BP: 122/76  Pulse: 89  Temp: 97.9 F (36.6 C)  TempSrc: Oral  Resp: 16  Height: 5\' 4"  (1.626 m)  Weight: 168 lb 6.4 oz (76.386 kg)  SpO2: 97%      Assessment & Plan:   Brandy Leonard is a 68 y.o. female Internal hemorrhoid - Plan: hydrocortisone (ANUSOL-HC) 25 MG suppository  - stable, with intermittent occurrence - refilled Anusol HC sup prn.   Post-nasal drip -Allergic rhinitis,  Sinus pressure - Plan: fluticasone (FLONASE) 50 MCG/ACT nasal spray, DISCONTINUED: fluticasone (FLONASE) 50 MCG/ACT nasal spray  -suspect allergic cause of sinus pressure, and not active sinus infection at this point. possibel upper tooth abscess or need for root canal - has appt with dental provider today.  Afrin (3days max), flonase NS and atrovent NS if needed. If not improving with above or sx's persist post 10-14 days - consider repeat abx.   Insomnia - Plan: LORazepam (ATIVAN) 0.5 MG  tablet  -stable.  Refilled meds for 4 motnhs to allow time for appt with Brandy Leonard.   Lichen sclerosis - by hx, vulvar. Requested receords from GYN and can discuss if we can treat here at next appt with Brandy Leonard.  Records pending from GYN. Still has steroid cream - no refill needed at this point.     Meds ordered this encounter  Medications   hydrocortisone (ANUSOL-HC) 25 MG suppository    Sig: Place 1 suppository (25 mg total) rectally daily as needed for hemorrhoids or itching. As needed    Dispense:  30 suppository    Refill:  0   ipratropium (ATROVENT) 0.03 % nasal spray    Sig: Place 2 sprays into the nose 3 (three) times daily as needed for rhinitis. Use as needed for post- nasal drip    Dispense:  30 mL    Refill:  6   DISCONTD: fluticasone (FLONASE) 50 MCG/ACT nasal spray    Sig: Place 1-2 sprays into both nostrils daily.  Dispense:  16 g    Refill:  6   LORazepam (ATIVAN) 0.5 MG tablet    Sig: Take 1 tablet (0.5 mg total) by mouth at bedtime.    Dispense:  30 tablet    Refill:  3   fluticasone (FLONASE) 50 MCG/ACT nasal spray    Sig: Place 1-2 sprays into both nostrils daily.    Dispense:  16 g    Refill:  0   Patient Instructions  Afrin for up to 3 days, try the flonase nasal spray for allergies.  If discolored nasal discharge that is not improving in the next week to 10 days - call for possible antibiotic at that time. Return to the clinic or go to the nearest emergency room if any of your symptoms worsen or new symptoms occur. Other medicines refilled as discussed, but we will schedule appointment with Brandy Leonard to follow up on steroid cream for Lichen sclerosis and other refills.   Allergic Rhinitis Allergic rhinitis is when the mucous membranes in the nose respond to allergens. Allergens are particles in the air that cause your body to have an allergic reaction. This causes you to release allergic antibodies. Through a chain of events, these  eventually cause you to release histamine into the blood stream. Although meant to protect the body, it is this release of histamine that causes your discomfort, such as frequent sneezing, congestion, and an itchy, runny nose.  CAUSES  Seasonal allergic rhinitis (hay fever) is caused by pollen allergens that may come from grasses, trees, and weeds. Year-round allergic rhinitis (perennial allergic rhinitis) is caused by allergens such as house dust mites, pet dander, and mold spores.  SYMPTOMS   Nasal stuffiness (congestion).  Itchy, runny nose with sneezing and tearing of the eyes. DIAGNOSIS  Your health care provider can help you determine the allergen or allergens that trigger your symptoms. If you and your health care provider are unable to determine the allergen, skin or blood testing may be used. TREATMENT  Allergic Rhinitis does not have a cure, but it can be controlled by:  Medicines and allergy shots (immunotherapy).  Avoiding the allergen. Hay fever may often be treated with antihistamines in pill or nasal spray forms. Antihistamines block the effects of histamine. There are over-the-counter medicines that may help with nasal congestion and swelling around the eyes. Check with your health care provider before taking or giving this medicine.  If avoiding the allergen or the medicine prescribed do not work, there are many new medicines your health care provider can prescribe. Stronger medicine may be used if initial measures are ineffective. Desensitizing injections can be used if medicine and avoidance does not work. Desensitization is when a patient is given ongoing shots until the body becomes less sensitive to the allergen. Make sure you follow up with your health care provider if problems continue. HOME CARE INSTRUCTIONS It is not possible to completely avoid allergens, but you can reduce your symptoms by taking steps to limit your exposure to them. It helps to know exactly what you  are allergic to so that you can avoid your specific triggers. SEEK MEDICAL CARE IF:   You have a fever.  You develop a cough that does not stop easily (persistent).  You have shortness of breath.  You start wheezing.  Symptoms interfere with normal daily activities. Document Released: 02/20/2001 Document Revised: 03/18/2013 Document Reviewed: 02/02/2013 [facility] Patient Information 2014 [place].  [facility] Policy for Prescribing Controlled Substances (Revised 04/2012) 1. Prescriptions  for controlled substances will be filled by ONE provider at [facility] with whom you have established and developed a plan for your care, including follow-up. 2. You are encouraged to schedule an appointment with your prescriber at our appointment center for follow-up visits whenever possible. 3. If you request a prescription for the controlled substance while at [facility] for an acute problem (with someone other than your regular prescriber), you MAY be given a ONE-TIME prescription for a 30-day supply of the controlled substance, to allow time for you to return to see your regular prescriber for additional prescriptions.      I personally performed the services described in this documentation, which was scribed in my presence. The recorded information has been reviewed and is accurate.

## 2013-10-22 NOTE — Patient Instructions (Addendum)
Afrin for up to 3 days, try the flonase nasal spray for allergies.  If discolored nasal discharge that is not improving in the next week to 10 days - call for possible antibiotic at that time. Return to the clinic or go to the nearest emergency room if any of your symptoms worsen or new symptoms occur. Other medicines refilled as discussed, but we will schedule appointment with Dr. Joseph Art to follow up on steroid cream for Lichen sclerosis and other refills.   Allergic Rhinitis Allergic rhinitis is when the mucous membranes in the nose respond to allergens. Allergens are particles in the air that cause your body to have an allergic reaction. This causes you to release allergic antibodies. Through a chain of events, these eventually cause you to release histamine into the blood stream. Although meant to protect the body, it is this release of histamine that causes your discomfort, such as frequent sneezing, congestion, and an itchy, runny nose.  CAUSES  Seasonal allergic rhinitis (hay fever) is caused by pollen allergens that may come from grasses, trees, and weeds. Year-round allergic rhinitis (perennial allergic rhinitis) is caused by allergens such as house dust mites, pet dander, and mold spores.  SYMPTOMS   Nasal stuffiness (congestion).  Itchy, runny nose with sneezing and tearing of the eyes. DIAGNOSIS  Your health care provider can help you determine the allergen or allergens that trigger your symptoms. If you and your health care provider are unable to determine the allergen, skin or blood testing may be used. TREATMENT  Allergic Rhinitis does not have a cure, but it can be controlled by:  Medicines and allergy shots (immunotherapy).  Avoiding the allergen. Hay fever may often be treated with antihistamines in pill or nasal spray forms. Antihistamines block the effects of histamine. There are over-the-counter medicines that may help with nasal congestion and swelling around the eyes.  Check with your health care provider before taking or giving this medicine.  If avoiding the allergen or the medicine prescribed do not work, there are many new medicines your health care provider can prescribe. Stronger medicine may be used if initial measures are ineffective. Desensitizing injections can be used if medicine and avoidance does not work. Desensitization is when a patient is given ongoing shots until the body becomes less sensitive to the allergen. Make sure you follow up with your health care provider if problems continue. HOME CARE INSTRUCTIONS It is not possible to completely avoid allergens, but you can reduce your symptoms by taking steps to limit your exposure to them. It helps to know exactly what you are allergic to so that you can avoid your specific triggers. SEEK MEDICAL CARE IF:   You have a fever.  You develop a cough that does not stop easily (persistent).  You have shortness of breath.  You start wheezing.  Symptoms interfere with normal daily activities. Document Released: 02/20/2001 Document Revised: 03/18/2013 Document Reviewed: 02/02/2013 [facility] Patient Information 2014 [place].  [facility] Policy for Prescribing Controlled Substances (Revised 04/2012) 1. Prescriptions for controlled substances will be filled by ONE provider at [facility] with whom you have established and developed a plan for your care, including follow-up. 2. You are encouraged to schedule an appointment with your prescriber at our appointment center for follow-up visits whenever possible. 3. If you request a prescription for the controlled substance while at [facility] for an acute problem (with someone other than your regular prescriber), you MAY be given a ONE-TIME prescription for a 30-day supply of the  controlled substance, to allow time for you to return to see your regular prescriber for additional prescriptions.

## 2013-10-29 ENCOUNTER — Telehealth: Payer: Self-pay

## 2013-10-29 NOTE — Telephone Encounter (Signed)
Fax from [facility] mail order requests change from hydrocort ac suppositories to proctozone if appropriate since it is on ins formulary and hydrocort is not. Dr Carlota Raspberry, please review.

## 2013-10-29 NOTE — Telephone Encounter (Signed)
I am ok with this, but please call patient and discuss this with her, make sure she is ok with this substitution as I believe she had a substitution in the past she was not happy with. If she is ok with substitution, can send it in. Thanks. -JG.

## 2013-10-30 NOTE — Progress Notes (Signed)
LMVM to CB to schedule appointment for CPE.

## 2013-11-02 MED ORDER — HYDROCORTISONE 2.5 % RE CREA: 1.0000 "application " | TOPICAL_CREAM | Freq: Every day | RECTAL | Status: DC | PRN

## 2013-11-02 NOTE — Telephone Encounter (Signed)
Pt stated that she has not tried the proctozone prev and is willing to try it. Sending in Rx

## 2013-11-08 ENCOUNTER — Encounter: Payer: Self-pay | Admitting: Family Medicine

## 2013-11-23 ENCOUNTER — Ambulatory Visit (INDEPENDENT_AMBULATORY_CARE_PROVIDER_SITE_OTHER): Payer: Medicare Other | Admitting: Family Medicine

## 2013-11-23 ENCOUNTER — Encounter: Payer: Self-pay | Admitting: Family Medicine

## 2013-11-23 VITALS — BP 140/86 | HR 86 | Temp 98.5°F | Resp 16 | Ht 64.5 in | Wt 164.8 lb

## 2013-11-23 DIAGNOSIS — J309 Allergic rhinitis, unspecified: Secondary | ICD-10-CM | POA: Diagnosis not present

## 2013-11-23 DIAGNOSIS — J3489 Other specified disorders of nose and nasal sinuses: Secondary | ICD-10-CM | POA: Diagnosis not present

## 2013-11-23 DIAGNOSIS — R5381 Other malaise: Secondary | ICD-10-CM

## 2013-11-23 DIAGNOSIS — R5383 Other fatigue: Secondary | ICD-10-CM | POA: Diagnosis not present

## 2013-11-23 DIAGNOSIS — K648 Other hemorrhoids: Secondary | ICD-10-CM | POA: Diagnosis not present

## 2013-11-23 DIAGNOSIS — R0982 Postnasal drip: Secondary | ICD-10-CM | POA: Diagnosis not present

## 2013-11-23 DIAGNOSIS — G4700 Insomnia, unspecified: Secondary | ICD-10-CM | POA: Diagnosis not present

## 2013-11-23 DIAGNOSIS — Z0000 Encounter for general adult medical examination without abnormal findings: Secondary | ICD-10-CM | POA: Diagnosis not present

## 2013-11-23 LAB — COMPLETE METABOLIC PANEL WITH GFR
ALT: 12 U/L (ref 0–35)
AST: 16 U/L (ref 0–37)
Albumin: 4.5 g/dL (ref 3.5–5.2)
Alkaline Phosphatase: 88 U/L (ref 39–117)
BUN: 16 mg/dL (ref 6–23)
CO2: 27 mEq/L (ref 19–32)
Calcium: 9.8 mg/dL (ref 8.4–10.5)
Chloride: 104 mEq/L (ref 96–112)
Creat: 0.65 mg/dL (ref 0.50–1.10)
GFR, Est African American: >89 mL/min
GFR, Est Non African American: >89 mL/min
Glucose, Bld: 103 mg/dL — ABNORMAL HIGH (ref 70–99)
Potassium: 4.8 mEq/L (ref 3.5–5.3)
Sodium: 140 mEq/L (ref 135–145)
Total Bilirubin: 0.4 mg/dL (ref 0.2–1.2)
Total Protein: 7.4 g/dL (ref 6.0–8.3)

## 2013-11-23 LAB — POCT URINALYSIS DIPSTICK
Glucose, UA: NEGATIVE
Ketones, UA: NEGATIVE
Leukocytes, UA: NEGATIVE
Nitrite, UA: NEGATIVE
Protein, UA: 30
Spec Grav, UA: 1.02
Urobilinogen, UA: 0.2
pH, UA: 6

## 2013-11-23 MED ORDER — CLOBETASOL PROPIONATE 0.05 % EX CREA: 1.0000 "application " | TOPICAL_CREAM | Freq: Two times a day (BID) | CUTANEOUS | Status: DC

## 2013-11-23 MED ORDER — FLUTICASONE PROPIONATE 50 MCG/ACT NA SUSP: 1.0000 | Freq: Every day | NASAL | Status: DC

## 2013-11-23 MED ORDER — LORAZEPAM 0.5 MG PO TABS: 0.5000 mg | ORAL_TABLET | Freq: Every day | ORAL | Status: DC

## 2013-11-23 MED ORDER — HYDROCORTISONE ACETATE 25 MG RE SUPP: 25.0000 mg | Freq: Every day | RECTAL | Status: DC | PRN

## 2013-11-23 MED ORDER — HYDROCORTISONE ACETATE 25 MG RE SUPP
25.0000 mg | Freq: Every day | RECTAL | Status: DC | PRN
Start: 2013-11-23 — End: 2013-11-23

## 2013-11-23 NOTE — Patient Instructions (Signed)

## 2013-11-23 NOTE — Progress Notes (Signed)
Subjective:    Patient ID: Brandy Leonard, female    DOB: 1945/11/07, 68 y.o.   MRN: [id number]  This chart was scribed for Robyn Haber, MD by Maree Erie, ED Scribe.   Chief Complaint  Patient presents with   Annual Exam    medication  refill    PCP: Robyn Haber, MD   HPI  Brandy Leonard is a 68 y.o. female who presents to office for a complete physical exam.  She had gyn exam with Dr. Ronita Hipps last January  Last Tetanus: 2012 Shingles Vaccine: up to date Pneumonia Vaccine: up to date Bony Density: will be getting a scan done this year Mammogram: up to date  She needs refills on her Flonase, Clobetasol cream and hydrocortisone suppositories. She uses [facility] that mails her prescriptions to her. She also needs a refill on Ativan but gets that at [facility] She typically uses one a day at night.  She states that she is doing well and still working. She just moved down to the [place] and recently moved to [place] and [employer]. She has to be on call on weekends but she is able to refer them out so she gets to set her hours. 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 She does not have any children so she enjoys working still.   Patient Active Problem List   Diagnosis Date Noted   Insomnia 02/26/2012   Past Medical History  Diagnosis Date   Allergy    Insomnia    No past surgical history on file. Allergies  Allergen Reactions   Prednisone Swelling    Of face   Prior to Admission medications   Medication Sig Start Date End Date Taking? Authorizing Provider  amoxicillin-clavulanate (AUGMENTIN) 875-125 MG per tablet Take 1 tablet by mouth 2 (two) times daily. 04/29/13   Wardell Honour, MD  benzonatate (TESSALON) 100 MG capsule Take 1-2 capsules (100-200 mg total) by mouth 3 (three) times daily as needed for cough. 04/29/13   Wardell Honour, MD  fluticasone (FLONASE) 50 MCG/ACT nasal spray Place 1-2 sprays into both nostrils daily.  10/22/13   Wendie Agreste, MD  hydrocortisone (ANUSOL-HC) 25 MG suppository Place 1 suppository (25 mg total) rectally daily as needed for hemorrhoids or itching. As needed 10/22/13   Wendie Agreste, MD  hydrocortisone (PROCTOZONE-HC) 2.5 % rectal cream Place 1 application rectally daily as needed for hemorrhoids or itching. 11/02/13   Wendie Agreste, MD  hydrocortisone cream 1 % Apply 1 application topically 2 (two) times daily. 04/29/13   Wardell Honour, MD  ipratropium (ATROVENT) 0.03 % nasal spray Place 2 sprays into the nose 3 (three) times daily as needed for rhinitis. Use as needed for post- nasal drip 10/22/13   Wendie Agreste, MD  LORazepam (ATIVAN) 0.5 MG tablet Take 1 tablet (0.5 mg total) by mouth at bedtime. 10/22/13   Wendie Agreste, MD   History   Social History   Marital Status: Divorced    Spouse Name: N/A    Number of Children: N/A   Years of Education: N/A   Occupational History   Not on file.   Social History Main Topics   Smoking status: Former Smoker   Smokeless tobacco: Not on file   Alcohol Use: 1.2 oz/week    2 Glasses of wine per week   Drug Use: No   Sexual Activity: No   Other Topics Concern   Not on file  Social History Narrative   No narrative on file      Review of Systems Review of Systems: Consitutional: No fever, chills, fatigue, night sweats, lymphadenopathy, or weight changes. Eyes: No visual changes, eye redness, or discharge. ENT/Mouth: Ears: No otalgia, tinnitus, hearing loss, discharge. Nose: No congestion, rhinorrhea, sinus pain, or epistaxis. Throat: No sore throat, post nasal drip, or teeth pain. Cardiovascular: No CP, palpitations, diaphoresis, DOE, edema, orthopnea, PND. Respiratory: No cough, hemoptysis, SOB, or wheezing. Gastrointestinal: No anorexia, dysphagia, reflux, pain, nausea, vomiting, hematemesis, diarrhea, constipation, BRBPR, or melena. Genitourinary: No dysuria, frequency, urgency, hematuria,  incontinence, nocturia, decreased urinary stream, discharge, impotence, or testicular pain/masses. Musculoskeletal: No decreased ROM, myalgias, stiffness, joint swelling, or weakness. Skin: No rash, erythema, lesion changes, pain, warmth, jaundice, or pruritis. Neurological: No headache, dizziness, syncope, seizures, tremors, memory loss, coordination problems, or paresthesias. Psychological: No anxiety, depression, hallucinations, SI/HI. Endocrine: No fatigue, polydipsia, polyphagia or polyuria. All other systems were reviewed and are otherwise negative.     Objective:   Physical Exam  General: Well-developed, well-nourished female in no acute distress; appearance consistent with age of record HENT: normocephalic; atraumatic; TMs normal; canals normal bilaterally; oropharynx clear and moist Eyes: pupils equal, round and reactive to light; extraocular muscles intact;  Neck: supple; no carotid bruit Heart: regular rate and rhythm; no murmurs, rubs or gallops Lungs: clear to auscultation bilaterally Abdomen: soft; nondistended; nontender; no masses or hepatosplenomegaly; bowel sounds present Extremities: No deformity; full range of motion; pulses normal Neurologic: Awake, alert and oriented; motor function intact in all extremities and symmetric; no facial droop Skin: Warm and dry Psychiatric: Normal mood and affect      Assessment & Plan:   I personally performed the services described in this documentation, which was scribed in my presence. The recorded information has been reviewed and is accurate.  Routine general medical examination at a health care facility - Plan: POCT urinalysis dipstick, CBC with Differential, COMPLETE METABOLIC PANEL WITH GFR  Fatigue - Plan: CBC with Differential  Insomnia - Plan: LORazepam (ATIVAN) 0.5 MG tablet  Internal hemorrhoid - Plan: hydrocortisone (ANUSOL-HC) 25 MG suppository, DISCONTINUED: hydrocortisone (ANUSOL-HC) 25 MG  suppository  Post-nasal drip - Plan: fluticasone (FLONASE) 50 MCG/ACT nasal spray  Allergic rhinitis - Plan: fluticasone (FLONASE) 50 MCG/ACT nasal spray  Sinus pressure - Plan: fluticasone (FLONASE) 50 MCG/ACT nasal spray  Signed, Robyn Haber, MD

## 2013-11-24 ENCOUNTER — Other Ambulatory Visit: Payer: Self-pay | Admitting: Radiology

## 2013-11-24 LAB — CBC WITH DIFFERENTIAL/PLATELET
Basophils Absolute: 0 10*3/uL (ref 0.0–0.1)
Basophils Relative: 0 % (ref 0–1)
Eosinophils Absolute: 0.2 10*3/uL (ref 0.0–0.7)
Eosinophils Relative: 2 % (ref 0–5)
HCT: 41 % (ref 36.0–46.0)
Hemoglobin: 14 g/dL (ref 12.0–15.0)
Lymphocytes Relative: 19 % (ref 12–46)
Lymphs Abs: 1.5 10*3/uL (ref 0.7–4.0)
MCH: 28.1 pg (ref 26.0–34.0)
MCHC: 34.1 g/dL (ref 30.0–36.0)
MCV: 82.2 fL (ref 78.0–100.0)
Monocytes Absolute: 1 10*3/uL (ref 0.1–1.0)
Monocytes Relative: 13 % — ABNORMAL HIGH (ref 3–12)
Neutro Abs: 5.3 10*3/uL (ref 1.7–7.7)
Neutrophils Relative %: 66 % (ref 43–77)
Platelets: 310 10*3/uL (ref 150–400)
RBC: 4.99 MIL/uL (ref 3.87–5.11)
RDW: 13.8 % (ref 11.5–15.5)
WBC: 8 10*3/uL (ref 4.0–10.5)

## 2013-11-25 ENCOUNTER — Other Ambulatory Visit: Payer: Self-pay | Admitting: Family Medicine

## 2014-01-30 ENCOUNTER — Other Ambulatory Visit: Payer: Self-pay | Admitting: Family Medicine

## 2014-01-30 MED ORDER — HYDROCORTISONE ACETATE 30 MG RE SUPP
1.0000 | Freq: Every evening | RECTAL | Status: DC | PRN
Start: 2014-01-30 — End: 2014-04-29

## 2014-02-09 ENCOUNTER — Telehealth: Payer: Self-pay

## 2014-02-09 MED ORDER — HYDROCORTISONE 2.5 % RE CREA: 1.0000 "application " | TOPICAL_CREAM | Freq: Two times a day (BID) | RECTAL | Status: DC

## 2014-02-09 NOTE — Telephone Encounter (Signed)
[facility] sent fax asking for change from Hydrocort suppositories to Proctozone d/t lower cost. Do you want to send in Rx for Proctozone? Pended, but wasn't sure what directions you would want.

## 2014-02-09 NOTE — Telephone Encounter (Signed)
Patient has used hydrocortisone suppositories for hemorrhoids. Her insurance no longer covers the previous brand. Therefore a rotatory different brand that she would take each bedtime when necessary. I'm not sure what the problem is.

## 2014-02-09 NOTE — Telephone Encounter (Signed)
I'm sorry Dr L, I don't understand.  When I put "proctozone" in Surescripts it comes up with above cream. I added a comment asking to fill w/Proctozone suppositories. Is this Ok to change to? Do you want her to just use at night or BID like sig that pops up in Surescripts?

## 2014-02-09 NOTE — Telephone Encounter (Signed)
Any hydrocortisone suppositories are okay. Use hs prn

## 2014-03-15 DIAGNOSIS — H04129 Dry eye syndrome of unspecified lacrimal gland: Secondary | ICD-10-CM | POA: Diagnosis not present

## 2014-03-15 DIAGNOSIS — L980 Pyogenic granuloma: Secondary | ICD-10-CM | POA: Diagnosis not present

## 2014-03-18 ENCOUNTER — Telehealth: Payer: Self-pay

## 2014-03-18 DIAGNOSIS — G4700 Insomnia, unspecified: Secondary | ICD-10-CM

## 2014-03-18 NOTE — Telephone Encounter (Signed)
Pt called to say that she accidentally got water in her lorazepam bottle and that the pills have disintegrated. She would like to have another refill called in to the [facility] on [address]. Please call [phone]

## 2014-03-19 MED ORDER — LORAZEPAM 0.5 MG PO TABS: 0.5000 mg | ORAL_TABLET | Freq: Every day | ORAL | Status: DC

## 2014-03-19 NOTE — Telephone Encounter (Signed)
Done.  Dr Joseph Art - please do this in the future - the PAs cannot write refills.

## 2014-03-19 NOTE — Telephone Encounter (Signed)
Okay to refill x 5 months

## 2014-03-20 NOTE — Telephone Encounter (Signed)
Faxed. Pt notified. Only sent in #30. Can we do #60 when you get back so pt has a 90 day supply please Dr. Carlean Jews. Thanks

## 2014-03-25 ENCOUNTER — Telehealth: Payer: Self-pay | Admitting: Family Medicine

## 2014-03-25 DIAGNOSIS — G4700 Insomnia, unspecified: Secondary | ICD-10-CM

## 2014-03-25 MED ORDER — LORAZEPAM 0.5 MG PO TABS: 0.5000 mg | ORAL_TABLET | Freq: Every day | ORAL | Status: DC

## 2014-03-25 NOTE — Telephone Encounter (Signed)
Pt calling for second time about rx she dropped water on,would like [place] to call pharmacy and let them know that she can pay for it without the insurance

## 2014-03-25 NOTE — Telephone Encounter (Signed)
Patient called to check status on previous message. She stated she got water in pill holder and it ruined the pills. She is requesting new RX called in to [facility]. She says she knows insurance will not cover since she had them filled recently and she will pay out of pocket. Explained I would need Dr. Joseph Art approval. Please advise

## 2014-03-25 NOTE — Telephone Encounter (Signed)
Pt requests ativan 0.5 mg refill, states she poured water in medication which ruined it. Last filled: 03/19/2014 Please advise.

## 2014-03-26 NOTE — Telephone Encounter (Signed)
Prescription has been called into the pharmacy. Advised they could fill early due to recent script was destroyed.

## 2014-03-26 NOTE — Telephone Encounter (Signed)
Pt advised.

## 2014-04-12 DIAGNOSIS — L980 Pyogenic granuloma: Secondary | ICD-10-CM | POA: Diagnosis not present

## 2014-04-28 ENCOUNTER — Telehealth: Payer: Self-pay

## 2014-04-28 NOTE — Telephone Encounter (Signed)
Pt called and LM that pharm is sending request to fill the refill they have on file for 90 day supply of lorazepam early because the original Rx for 90 days was dropped in water. We had sent in a 30 day supply on 03/26/14 and the pt is now out of this. The refill last month was approved to replace the ruined one, but pt is now out again. I called and verified all of this with pharmacy and OKd the refill of the 90 day on file.

## 2014-04-29 ENCOUNTER — Ambulatory Visit (INDEPENDENT_AMBULATORY_CARE_PROVIDER_SITE_OTHER): Payer: Medicare Other | Admitting: Emergency Medicine

## 2014-04-29 ENCOUNTER — Ambulatory Visit (INDEPENDENT_AMBULATORY_CARE_PROVIDER_SITE_OTHER): Payer: Medicare Other

## 2014-04-29 VITALS — BP 112/72 | HR 86 | Temp 98.0°F | Resp 16 | Ht 64.5 in | Wt 170.4 lb

## 2014-04-29 DIAGNOSIS — M79644 Pain in right finger(s): Secondary | ICD-10-CM

## 2014-04-29 DIAGNOSIS — L989 Disorder of the skin and subcutaneous tissue, unspecified: Secondary | ICD-10-CM

## 2014-04-29 DIAGNOSIS — L918 Other hypertrophic disorders of the skin: Secondary | ICD-10-CM | POA: Diagnosis not present

## 2014-04-29 DIAGNOSIS — L820 Inflamed seborrheic keratosis: Secondary | ICD-10-CM

## 2014-04-29 DIAGNOSIS — L821 Other seborrheic keratosis: Secondary | ICD-10-CM | POA: Diagnosis not present

## 2014-04-29 MED ORDER — HYDROCORTISONE 2.5 % RE CREA: 1.0000 "application " | TOPICAL_CREAM | Freq: Two times a day (BID) | RECTAL | Status: DC

## 2014-04-29 NOTE — Progress Notes (Signed)
[facility] [address], [place] [address] [phone]  Date:  04/29/2014   Name:  Brandy Leonard   DOB:  03/07/1946   MRN:  [id number]  PCP:  Robyn Haber, MD    Chief Complaint: Hand Pain and Mass   History of Present Illness:  Brandy Leonard is a 68 y.o. very pleasant female patient who presents with the following:  Jammed her right fifth finger a week ago and still has pain at DIP. Lesion on back that is annoyed by her bra strap.  No bleeding. Has numerous skin tags that are annoyed by the collar of her shirt. Denies other complaint or health concern today.   Patient Active Problem List   Diagnosis Date Noted  . Insomnia 02/26/2012    Past Medical History  Diagnosis Date  . Allergy   . Insomnia     No past surgical history on file.  History  Substance Use Topics  . Smoking status: Former Research scientist (life sciences)  . Smokeless tobacco: Not on file  . Alcohol Use: 1.2 oz/week    2 Glasses of wine per week    Family History  Problem Relation Age of Onset  . Hypertension Mother   . Thyroid disease Father   . Hypertension Brother   . Stroke Maternal Grandmother   . Cancer Maternal Grandfather     Allergies  Allergen Reactions  . Prednisone Swelling    Of face    Medication list has been reviewed and updated.  Current Outpatient Prescriptions on File Prior to Visit  Medication Sig Dispense Refill  . clobetasol cream (TEMOVATE) 5.05 % Apply 1 application topically 2 (two) times daily. 30 g 11  . fluticasone (FLONASE) 50 MCG/ACT nasal spray Place 1-2 sprays into both nostrils daily. 16 g 11  . hydrocortisone (ANUSOL-HC) 2.5 % rectal cream Place 1 application rectally 2 (two) times daily. 30 g 0  . LORazepam (ATIVAN) 0.5 MG tablet Take 1 tablet (0.5 mg total) by mouth at bedtime. 30 tablet 0   No current facility-administered medications on file prior to visit.    Review of Systems:   As per HPI, otherwise negative.    Physical  Examination: Filed Vitals:   04/29/14 1346  BP: 112/72  Pulse: 86  Temp: 98 F (36.7 C)  Resp: 16   Filed Vitals:   04/29/14 1346  Height: 5' 4.5" (1.638 m)  Weight: 170 lb 6.4 oz (77.293 kg)   Body mass index is 28.81 kg/(m^2). Ideal Body Weight: Weight in (lb) to have BMI = 25: 147.6   GEN: WDWN, NAD, Non-toxic, Alert & Oriented x 3 HEENT: Atraumatic, Normocephalic.  Ears and Nose: No external deformity. EXTR: No clubbing/cyanosis/edema NEURO: Normal gait.  PSYCH: Normally interactive. Conversant. Not depressed or anxious appearing.  Calm demeanor.  SKIN:  6-8 small skin tabs on collar line Seborrheic keratosis of midline mid back. Right fifth finger tender DIP   Assessment and Plan: Sprain finger  Signed,  Ellison Carwin, MD   [facility] reading (PRIMARY) by  Dr. Ouida Sills  Negative finger.

## 2014-04-29 NOTE — Progress Notes (Signed)
Patient ID: Brandy Leonard, female   DOB: [birth year], 68 y.o.   MRN: [id number] I directly participated during the procedure.

## 2014-04-29 NOTE — Progress Notes (Signed)
Five small skin tags removed with iris sissors from left side of neck. No anesthesia used. Bandaid applied to one due to minor bleeding.  Cryotherapy x2 done on three separate seborrheic keratoses on back.   Julieta Gutting, PA-C Physician Assistant-Certified [facility]  04/29/2014 9:10 PM

## 2014-05-18 ENCOUNTER — Ambulatory Visit (INDEPENDENT_AMBULATORY_CARE_PROVIDER_SITE_OTHER): Payer: Medicare Other | Admitting: Emergency Medicine

## 2014-05-18 VITALS — BP 120/80 | HR 84 | Temp 98.1°F | Resp 16 | Ht 60.25 in | Wt 172.0 lb

## 2014-05-18 DIAGNOSIS — Z23 Encounter for immunization: Secondary | ICD-10-CM

## 2014-05-18 DIAGNOSIS — M5431 Sciatica, right side: Secondary | ICD-10-CM | POA: Diagnosis not present

## 2014-05-18 MED ORDER — CYCLOBENZAPRINE HCL 10 MG PO TABS: 10.0000 mg | ORAL_TABLET | Freq: Three times a day (TID) | ORAL | Status: DC | PRN

## 2014-05-18 MED ORDER — NAPROXEN SODIUM 550 MG PO TABS: 550.0000 mg | ORAL_TABLET | Freq: Two times a day (BID) | ORAL | Status: DC

## 2014-05-18 NOTE — Patient Instructions (Signed)
Sciatica Sciatica is pain, weakness, numbness, or tingling along the path of the sciatic nerve. The nerve starts in the lower back and runs down the back of each leg. The nerve controls the muscles in the lower leg and in the back of the knee, while also providing sensation to the back of the thigh, lower leg, and the sole of your foot. Sciatica is a symptom of another medical condition. For instance, nerve damage or certain conditions, such as a herniated disk or bone spur on the spine, pinch or put pressure on the sciatic nerve. This causes the pain, weakness, or other sensations normally associated with sciatica. Generally, sciatica only affects one side of the body. CAUSES   Herniated or slipped disc.  Degenerative disk disease.  A pain disorder involving the narrow muscle in the buttocks (piriformis syndrome).  Pelvic injury or fracture.  Pregnancy.  Tumor (rare). SYMPTOMS  Symptoms can vary from mild to very severe. The symptoms usually travel from the low back to the buttocks and down the back of the leg. Symptoms can include:  Mild tingling or dull aches in the lower back, leg, or hip.  Numbness in the back of the calf or sole of the foot.  Burning sensations in the lower back, leg, or hip.  Sharp pains in the lower back, leg, or hip.  Leg weakness.  Severe back pain inhibiting movement. These symptoms may get worse with coughing, sneezing, laughing, or prolonged sitting or standing. Also, being overweight may worsen symptoms. DIAGNOSIS  Your caregiver will perform a physical exam to look for common symptoms of sciatica. He or she may ask you to do certain movements or activities that would trigger sciatic nerve pain. Other tests may be performed to find the cause of the sciatica. These may include:  Blood tests.  X-rays.  Imaging tests, such as an MRI or CT scan. TREATMENT  Treatment is directed at the cause of the sciatic pain. Sometimes, treatment is not necessary  and the pain and discomfort goes away on its own. If treatment is needed, your caregiver may suggest:  Over-the-counter medicines to relieve pain.  Prescription medicines, such as anti-inflammatory medicine, muscle relaxants, or narcotics.  Applying heat or ice to the painful area.  Steroid injections to lessen pain, irritation, and inflammation around the nerve.  Reducing activity during periods of pain.  Exercising and stretching to strengthen your abdomen and improve flexibility of your spine. Your caregiver may suggest losing weight if the extra weight makes the back pain worse.  Physical therapy.  Surgery to eliminate what is pressing or pinching the nerve, such as a bone spur or part of a herniated disk. HOME CARE INSTRUCTIONS   Only take over-the-counter or prescription medicines for pain or discomfort as directed by your caregiver.  Apply ice to the affected area for 20 minutes, 3-4 times a day for the first 48-72 hours. Then try heat in the same way.  Exercise, stretch, or perform your usual activities if these do not aggravate your pain.  Attend physical therapy sessions as directed by your caregiver.  Keep all follow-up appointments as directed by your caregiver.  Do not wear high heels or shoes that do not provide proper support.  Check your mattress to see if it is too soft. A firm mattress may lessen your pain and discomfort. SEEK IMMEDIATE MEDICAL CARE IF:   You lose control of your bowel or bladder (incontinence).  You have increasing weakness in the lower back, pelvis, buttocks,   or legs.  You have redness or swelling of your back.  You have a burning sensation when you urinate.  You have pain that gets worse when you lie down or awakens you at night.  Your pain is worse than you have experienced in the past.  Your pain is lasting longer than 4 weeks.  You are suddenly losing weight without reason. MAKE SURE YOU:  Understand these  instructions.  Will watch your condition.  Will get help right away if you are not doing well or get worse. Document Released: 05/22/2001 Document Revised: 11/27/2011 Document Reviewed: 10/07/2011 ExitCare Patient Information 2015 [employer]. This information is not intended to replace advice given to you by your health care provider. Make sure you discuss any questions you have with your health care provider.  

## 2014-05-18 NOTE — Progress Notes (Signed)
[facility] [address] [place]  [address] [phone]  Date:  05/18/2014   Name:  Brandy Leonard   DOB:  07/07/1945   MRN:  [id number]  PCP:  Robyn Haber, MD    Chief Complaint: Toe Pain   History of Present Illness:  Brandy Leonard is a 68 y.o. 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 No improvement with over the counter medications or other home remedies.  Denies other complaint or health concern today.    Patient Active Problem List   Diagnosis Date Noted  . Insomnia 02/26/2012    Past Medical History  Diagnosis Date  . Allergy   . Insomnia     History reviewed. No pertinent past surgical history.  History  Substance Use Topics  . Smoking status: Former Research scientist (life sciences)  . Smokeless tobacco: Not on file  . Alcohol Use: 1.2 oz/week    2 Glasses of wine per week    Family History  Problem Relation Age of Onset  . Hypertension Mother   . Thyroid disease Father   . Hypertension Brother   . Stroke Maternal Grandmother   . Cancer Maternal Grandfather     Allergies  Allergen Reactions  . Prednisone Swelling    Of face    Medication list has been reviewed and updated.  Current Outpatient Prescriptions on File Prior to Visit  Medication Sig Dispense Refill  . clobetasol cream (TEMOVATE) 2.95 % Apply 1 application topically 2 (two) times daily. 30 g 11  . fluticasone (FLONASE) 50 MCG/ACT nasal spray Place 1-2 sprays into both nostrils daily. 16 g 11  . LORazepam (ATIVAN) 0.5 MG tablet Take 1 tablet (0.5 mg total) by mouth at bedtime. 30 tablet 0  . hydrocortisone (ANUSOL-HC) 2.5 % rectal cream Place 1 application rectally 2 (two) times daily. 30 g  0   No current facility-administered medications on file prior to visit.    Review of Systems:  As per HPI, otherwise negative.    Physical Examination: Filed Vitals:   05/18/14 1604  BP: 120/80  Pulse: 84  Temp: 98.1 F (36.7 C)  Resp: 16   Filed Vitals:   05/18/14 1604  Height: 5' 0.25" (1.53 m)  Weight: 172 lb (78.019 kg)   Body mass index is 33.33 kg/(m^2). Ideal Body Weight: Weight in (lb) to have BMI = 25: 128.8   GEN: WDWN, NAD, Non-toxic, Alert & Oriented x 3 HEENT: Atraumatic, Normocephalic.  Ears and Nose: No external deformity. EXTR: No clubbing/cyanosis/edema NEURO: Normal gait.  PSYCH: Normally interactive. Conversant. Not depressed or anxious appearing.  Calm demeanor.  Back:  No tenderness Neuro grossly intact Foot not tender and no swelling and full ROM    Assessment and Plan: Sciatic neuritis Anaprox Flexeril  Signed,  Ellison Carwin, MD

## 2014-05-20 ENCOUNTER — Telehealth: Payer: Self-pay

## 2014-05-20 NOTE — Telephone Encounter (Signed)
Spoke to patient.  She states that she had shot this week.

## 2014-05-24 ENCOUNTER — Other Ambulatory Visit (HOSPITAL_COMMUNITY): Payer: Self-pay | Admitting: Obstetrics and Gynecology

## 2014-05-24 DIAGNOSIS — M8580 Other specified disorders of bone density and structure, unspecified site: Secondary | ICD-10-CM

## 2014-05-24 DIAGNOSIS — M810 Age-related osteoporosis without current pathological fracture: Secondary | ICD-10-CM

## 2014-05-24 DIAGNOSIS — Z8739 Personal history of other diseases of the musculoskeletal system and connective tissue: Secondary | ICD-10-CM

## 2014-05-24 DIAGNOSIS — Z1231 Encounter for screening mammogram for malignant neoplasm of breast: Secondary | ICD-10-CM

## 2014-06-01 DIAGNOSIS — D3131 Benign neoplasm of right choroid: Secondary | ICD-10-CM | POA: Diagnosis not present

## 2014-06-01 DIAGNOSIS — H3530 Unspecified macular degeneration: Secondary | ICD-10-CM | POA: Diagnosis not present

## 2014-06-01 DIAGNOSIS — H3531 Nonexudative age-related macular degeneration: Secondary | ICD-10-CM | POA: Diagnosis not present

## 2014-06-01 DIAGNOSIS — H04129 Dry eye syndrome of unspecified lacrimal gland: Secondary | ICD-10-CM | POA: Diagnosis not present

## 2014-06-01 DIAGNOSIS — H43399 Other vitreous opacities, unspecified eye: Secondary | ICD-10-CM | POA: Diagnosis not present

## 2014-06-01 DIAGNOSIS — H04123 Dry eye syndrome of bilateral lacrimal glands: Secondary | ICD-10-CM | POA: Diagnosis not present

## 2014-06-10 ENCOUNTER — Ambulatory Visit (HOSPITAL_COMMUNITY)
Admission: RE | Admit: 2014-06-10 | Discharge: 2014-06-10 | Disposition: A | Discharge status: Home / Self Care | Payer: Medicare Other | Source: Ambulatory Visit | Attending: Obstetrics and Gynecology | Admitting: Obstetrics and Gynecology

## 2014-06-10 DIAGNOSIS — Z13820 Encounter for screening for osteoporosis: Secondary | ICD-10-CM | POA: Insufficient documentation

## 2014-06-10 DIAGNOSIS — M810 Age-related osteoporosis without current pathological fracture: Secondary | ICD-10-CM

## 2014-06-10 DIAGNOSIS — Z780 Asymptomatic menopausal state: Secondary | ICD-10-CM | POA: Insufficient documentation

## 2014-06-10 DIAGNOSIS — Z1231 Encounter for screening mammogram for malignant neoplasm of breast: Secondary | ICD-10-CM | POA: Insufficient documentation

## 2014-06-10 DIAGNOSIS — M8589 Other specified disorders of bone density and structure, multiple sites: Secondary | ICD-10-CM | POA: Diagnosis not present

## 2014-06-23 ENCOUNTER — Other Ambulatory Visit: Payer: Self-pay | Admitting: Emergency Medicine

## 2014-06-24 NOTE — Telephone Encounter (Signed)
Dr Ouida Sills, do you want to give RFs?

## 2014-06-25 DIAGNOSIS — M9905 Segmental and somatic dysfunction of pelvic region: Secondary | ICD-10-CM | POA: Diagnosis not present

## 2014-06-25 DIAGNOSIS — M47817 Spondylosis without myelopathy or radiculopathy, lumbosacral region: Secondary | ICD-10-CM | POA: Diagnosis not present

## 2014-06-25 DIAGNOSIS — S338XXA Sprain of other parts of lumbar spine and pelvis, initial encounter: Secondary | ICD-10-CM | POA: Diagnosis not present

## 2014-06-25 DIAGNOSIS — S39012A Strain of muscle, fascia and tendon of lower back, initial encounter: Secondary | ICD-10-CM | POA: Diagnosis not present

## 2014-06-25 DIAGNOSIS — M9903 Segmental and somatic dysfunction of lumbar region: Secondary | ICD-10-CM | POA: Diagnosis not present

## 2014-06-25 DIAGNOSIS — M9904 Segmental and somatic dysfunction of sacral region: Secondary | ICD-10-CM | POA: Diagnosis not present

## 2014-06-28 DIAGNOSIS — M9903 Segmental and somatic dysfunction of lumbar region: Secondary | ICD-10-CM | POA: Diagnosis not present

## 2014-06-28 DIAGNOSIS — S39012A Strain of muscle, fascia and tendon of lower back, initial encounter: Secondary | ICD-10-CM | POA: Diagnosis not present

## 2014-06-28 DIAGNOSIS — M9905 Segmental and somatic dysfunction of pelvic region: Secondary | ICD-10-CM | POA: Diagnosis not present

## 2014-06-28 DIAGNOSIS — M9904 Segmental and somatic dysfunction of sacral region: Secondary | ICD-10-CM | POA: Diagnosis not present

## 2014-06-28 DIAGNOSIS — S338XXA Sprain of other parts of lumbar spine and pelvis, initial encounter: Secondary | ICD-10-CM | POA: Diagnosis not present

## 2014-06-28 DIAGNOSIS — M47817 Spondylosis without myelopathy or radiculopathy, lumbosacral region: Secondary | ICD-10-CM | POA: Diagnosis not present

## 2014-07-06 DIAGNOSIS — M9904 Segmental and somatic dysfunction of sacral region: Secondary | ICD-10-CM | POA: Diagnosis not present

## 2014-07-06 DIAGNOSIS — M9903 Segmental and somatic dysfunction of lumbar region: Secondary | ICD-10-CM | POA: Diagnosis not present

## 2014-07-06 DIAGNOSIS — M47817 Spondylosis without myelopathy or radiculopathy, lumbosacral region: Secondary | ICD-10-CM | POA: Diagnosis not present

## 2014-07-06 DIAGNOSIS — S39012A Strain of muscle, fascia and tendon of lower back, initial encounter: Secondary | ICD-10-CM | POA: Diagnosis not present

## 2014-07-06 DIAGNOSIS — M9905 Segmental and somatic dysfunction of pelvic region: Secondary | ICD-10-CM | POA: Diagnosis not present

## 2014-07-06 DIAGNOSIS — S338XXA Sprain of other parts of lumbar spine and pelvis, initial encounter: Secondary | ICD-10-CM | POA: Diagnosis not present

## 2014-07-08 DIAGNOSIS — M47817 Spondylosis without myelopathy or radiculopathy, lumbosacral region: Secondary | ICD-10-CM | POA: Diagnosis not present

## 2014-07-08 DIAGNOSIS — M9903 Segmental and somatic dysfunction of lumbar region: Secondary | ICD-10-CM | POA: Diagnosis not present

## 2014-07-08 DIAGNOSIS — S39012A Strain of muscle, fascia and tendon of lower back, initial encounter: Secondary | ICD-10-CM | POA: Diagnosis not present

## 2014-07-08 DIAGNOSIS — M9905 Segmental and somatic dysfunction of pelvic region: Secondary | ICD-10-CM | POA: Diagnosis not present

## 2014-07-08 DIAGNOSIS — S338XXA Sprain of other parts of lumbar spine and pelvis, initial encounter: Secondary | ICD-10-CM | POA: Diagnosis not present

## 2014-07-08 DIAGNOSIS — M9904 Segmental and somatic dysfunction of sacral region: Secondary | ICD-10-CM | POA: Diagnosis not present

## 2014-07-13 DIAGNOSIS — M47817 Spondylosis without myelopathy or radiculopathy, lumbosacral region: Secondary | ICD-10-CM | POA: Diagnosis not present

## 2014-07-13 DIAGNOSIS — S338XXA Sprain of other parts of lumbar spine and pelvis, initial encounter: Secondary | ICD-10-CM | POA: Diagnosis not present

## 2014-07-13 DIAGNOSIS — M9905 Segmental and somatic dysfunction of pelvic region: Secondary | ICD-10-CM | POA: Diagnosis not present

## 2014-07-13 DIAGNOSIS — S39012A Strain of muscle, fascia and tendon of lower back, initial encounter: Secondary | ICD-10-CM | POA: Diagnosis not present

## 2014-07-13 DIAGNOSIS — M9904 Segmental and somatic dysfunction of sacral region: Secondary | ICD-10-CM | POA: Diagnosis not present

## 2014-07-13 DIAGNOSIS — M9903 Segmental and somatic dysfunction of lumbar region: Secondary | ICD-10-CM | POA: Diagnosis not present

## 2014-07-15 DIAGNOSIS — M9905 Segmental and somatic dysfunction of pelvic region: Secondary | ICD-10-CM | POA: Diagnosis not present

## 2014-07-15 DIAGNOSIS — M9904 Segmental and somatic dysfunction of sacral region: Secondary | ICD-10-CM | POA: Diagnosis not present

## 2014-07-15 DIAGNOSIS — M47817 Spondylosis without myelopathy or radiculopathy, lumbosacral region: Secondary | ICD-10-CM | POA: Diagnosis not present

## 2014-07-15 DIAGNOSIS — S39012A Strain of muscle, fascia and tendon of lower back, initial encounter: Secondary | ICD-10-CM | POA: Diagnosis not present

## 2014-07-15 DIAGNOSIS — M9903 Segmental and somatic dysfunction of lumbar region: Secondary | ICD-10-CM | POA: Diagnosis not present

## 2014-07-15 DIAGNOSIS — S338XXA Sprain of other parts of lumbar spine and pelvis, initial encounter: Secondary | ICD-10-CM | POA: Diagnosis not present

## 2014-07-22 DIAGNOSIS — M47817 Spondylosis without myelopathy or radiculopathy, lumbosacral region: Secondary | ICD-10-CM | POA: Diagnosis not present

## 2014-07-22 DIAGNOSIS — M9902 Segmental and somatic dysfunction of thoracic region: Secondary | ICD-10-CM | POA: Diagnosis not present

## 2014-07-22 DIAGNOSIS — S39012A Strain of muscle, fascia and tendon of lower back, initial encounter: Secondary | ICD-10-CM | POA: Diagnosis not present

## 2014-07-22 DIAGNOSIS — M9903 Segmental and somatic dysfunction of lumbar region: Secondary | ICD-10-CM | POA: Diagnosis not present

## 2014-07-22 DIAGNOSIS — M9905 Segmental and somatic dysfunction of pelvic region: Secondary | ICD-10-CM | POA: Diagnosis not present

## 2014-07-22 DIAGNOSIS — S29012A Strain of muscle and tendon of back wall of thorax, initial encounter: Secondary | ICD-10-CM | POA: Diagnosis not present

## 2014-07-26 ENCOUNTER — Telehealth: Payer: Self-pay

## 2014-07-26 DIAGNOSIS — G4700 Insomnia, unspecified: Secondary | ICD-10-CM

## 2014-07-26 NOTE — Telephone Encounter (Signed)
Dr. Joseph Art, pt needs a refill of lorazopham qty 90. Her number is [phone]

## 2014-07-29 MED ORDER — LORAZEPAM 0.5 MG PO TABS
0.5000 mg | ORAL_TABLET | Freq: Every day | ORAL | Status: DC
Start: 2014-07-29 — End: 2014-08-06

## 2014-07-29 NOTE — Telephone Encounter (Signed)
Ready

## 2014-07-29 NOTE — Telephone Encounter (Signed)
I called in RX for lorazapam per [facility] only 30 day supply till dr Joseph Art gets back. Patient would like the rest when he returns

## 2014-08-06 MED ORDER — LORAZEPAM 0.5 MG PO TABS: 0.5000 mg | ORAL_TABLET | Freq: Every day | ORAL | Status: DC

## 2014-08-06 NOTE — Addendum Note (Signed)
Addended by: Robyn Haber on: 08/06/2014 05:47 PM   Modules accepted: Orders

## 2014-08-09 ENCOUNTER — Other Ambulatory Visit: Payer: Self-pay

## 2014-08-09 MED ORDER — LORAZEPAM 0.5 MG PO TABS: 0.5000 mg | ORAL_TABLET | Freq: Every day | ORAL | Status: DC

## 2014-08-09 NOTE — Addendum Note (Signed)
Addended by: Robyn Haber on: 08/09/2014 04:02 PM   Modules accepted: Orders

## 2014-08-09 NOTE — Telephone Encounter (Signed)
Rx for Lorazepam 0.5 mg faxed in.

## 2014-08-10 ENCOUNTER — Other Ambulatory Visit: Payer: Self-pay

## 2014-08-10 NOTE — Telephone Encounter (Signed)
The rest of the Rx of Lorazepam was sent in.

## 2014-08-12 ENCOUNTER — Other Ambulatory Visit: Payer: Self-pay | Admitting: Family Medicine

## 2014-08-12 DIAGNOSIS — S39012A Strain of muscle, fascia and tendon of lower back, initial encounter: Secondary | ICD-10-CM | POA: Diagnosis not present

## 2014-08-12 DIAGNOSIS — M9905 Segmental and somatic dysfunction of pelvic region: Secondary | ICD-10-CM | POA: Diagnosis not present

## 2014-08-12 DIAGNOSIS — M9902 Segmental and somatic dysfunction of thoracic region: Secondary | ICD-10-CM | POA: Diagnosis not present

## 2014-08-12 DIAGNOSIS — S29012A Strain of muscle and tendon of back wall of thorax, initial encounter: Secondary | ICD-10-CM | POA: Diagnosis not present

## 2014-08-12 DIAGNOSIS — M47817 Spondylosis without myelopathy or radiculopathy, lumbosacral region: Secondary | ICD-10-CM | POA: Diagnosis not present

## 2014-08-12 DIAGNOSIS — M9903 Segmental and somatic dysfunction of lumbar region: Secondary | ICD-10-CM | POA: Diagnosis not present

## 2014-08-27 ENCOUNTER — Telehealth: Payer: Self-pay | Admitting: *Deleted

## 2014-08-27 ENCOUNTER — Other Ambulatory Visit: Payer: Self-pay | Admitting: Radiology

## 2014-08-27 DIAGNOSIS — G4700 Insomnia, unspecified: Secondary | ICD-10-CM

## 2014-08-27 MED ORDER — LORAZEPAM 0.5 MG PO TABS: 0.5000 mg | ORAL_TABLET | Freq: Every day | ORAL | Status: DC

## 2014-08-27 NOTE — Telephone Encounter (Signed)
Patient requests refill of Ativan.  Medication pended

## 2014-08-27 NOTE — Telephone Encounter (Signed)
Gave pt message that we are working on getting her meds filled today.

## 2014-08-27 NOTE — Telephone Encounter (Signed)
Patient states she out of medication and she has called a couple of times trying to get a refill.   Patient is upset that she has problem getting refills every time she calls.  LORazepam (ATIVAN) 0.5 MG tablet  Pt # [facility] [address]

## 2014-08-30 NOTE — Telephone Encounter (Signed)
Called pharm who reported that they did already have a RF on file for pt and filled on 3/16. Since pt's call was after that, I wanted to make sure she was aware and had gotten her medication. She stated that the 90 day supply she got in Feb she had ruined by spilling water in the bottle and had needed a replacement. She stated that she did get a RF Sat for #90. I will call in just one 90 day RF that Dr L wrote on 08/27/14 so that she will have the total of 6 mos worth of RFs he intended.

## 2014-09-10 DIAGNOSIS — M47817 Spondylosis without myelopathy or radiculopathy, lumbosacral region: Secondary | ICD-10-CM | POA: Diagnosis not present

## 2014-09-10 DIAGNOSIS — M9902 Segmental and somatic dysfunction of thoracic region: Secondary | ICD-10-CM | POA: Diagnosis not present

## 2014-09-10 DIAGNOSIS — S39012A Strain of muscle, fascia and tendon of lower back, initial encounter: Secondary | ICD-10-CM | POA: Diagnosis not present

## 2014-09-10 DIAGNOSIS — M9905 Segmental and somatic dysfunction of pelvic region: Secondary | ICD-10-CM | POA: Diagnosis not present

## 2014-09-10 DIAGNOSIS — M9903 Segmental and somatic dysfunction of lumbar region: Secondary | ICD-10-CM | POA: Diagnosis not present

## 2014-09-10 DIAGNOSIS — S29012A Strain of muscle and tendon of back wall of thorax, initial encounter: Secondary | ICD-10-CM | POA: Diagnosis not present

## 2014-10-11 ENCOUNTER — Other Ambulatory Visit: Payer: Self-pay | Admitting: Family Medicine

## 2014-10-29 ENCOUNTER — Ambulatory Visit (INDEPENDENT_AMBULATORY_CARE_PROVIDER_SITE_OTHER): Payer: Medicare Other | Admitting: Family Medicine

## 2014-10-29 VITALS — BP 126/74 | HR 77 | Temp 98.0°F | Resp 18 | Ht 64.0 in | Wt 163.0 lb

## 2014-10-29 DIAGNOSIS — R350 Frequency of micturition: Secondary | ICD-10-CM | POA: Diagnosis not present

## 2014-10-29 DIAGNOSIS — J0101 Acute recurrent maxillary sinusitis: Secondary | ICD-10-CM | POA: Diagnosis not present

## 2014-10-29 DIAGNOSIS — G5761 Lesion of plantar nerve, right lower limb: Secondary | ICD-10-CM | POA: Diagnosis not present

## 2014-10-29 DIAGNOSIS — L900 Lichen sclerosus et atrophicus: Secondary | ICD-10-CM

## 2014-10-29 DIAGNOSIS — G4700 Insomnia, unspecified: Secondary | ICD-10-CM | POA: Diagnosis not present

## 2014-10-29 LAB — POCT UA - MICROSCOPIC ONLY
Casts, Ur, LPF, POC: NEGATIVE
Crystals, Ur, HPF, POC: NEGATIVE
Renal tubular cells: POSITIVE
Yeast, UA: NEGATIVE

## 2014-10-29 LAB — POCT URINALYSIS DIPSTICK
Bilirubin, UA: NEGATIVE
Blood, UA: NEGATIVE
Glucose, UA: NEGATIVE
Ketones, UA: NEGATIVE
Leukocytes, UA: NEGATIVE
Nitrite, UA: NEGATIVE
Protein, UA: NEGATIVE
Spec Grav, UA: 1.02
Urobilinogen, UA: 0.2
pH, UA: 7.5

## 2014-10-29 MED ORDER — FLUTICASONE PROPIONATE 50 MCG/ACT NA SUSP: NASAL | Status: DC

## 2014-10-29 MED ORDER — LORAZEPAM 0.5 MG PO TABS: 0.5000 mg | ORAL_TABLET | Freq: Every day | ORAL | Status: DC

## 2014-10-29 MED ORDER — FLUCONAZOLE 150 MG PO TABS: 150.0000 mg | ORAL_TABLET | Freq: Once | ORAL | Status: DC

## 2014-10-29 MED ORDER — HYDROCORTISONE 2.5 % RE CREA: 1.0000 "application " | TOPICAL_CREAM | Freq: Two times a day (BID) | RECTAL | Status: DC

## 2014-10-29 MED ORDER — CLOBETASOL PROPIONATE 0.05 % EX CREA: 1.0000 "application " | TOPICAL_CREAM | Freq: Two times a day (BID) | CUTANEOUS | Status: DC

## 2014-10-29 MED ORDER — AMOXICILLIN-POT CLAVULANATE 875-125 MG PO TABS: 1.0000 | ORAL_TABLET | Freq: Two times a day (BID) | ORAL | Status: DC

## 2014-10-29 NOTE — Progress Notes (Signed)
Subjective:    Patient ID: Brandy Leonard, female    DOB: Aug 18, 1945, 69 y.o.   MRN: [id number] This chart was scribed for Robyn Haber, MD by Steva Colder, ED Scribe. The patient was seen in room 10 at 11:09 AM.   Chief Complaint  Patient presents with   Nasal Congestion   Dysuria   Urinary Frequency   Medication Refill    clobetasol cream, anusol-all need 90day supply     HPI   Brandy Leonard is a 70 y.o. female who presents today complaining of nasal congestion onset 1 week . Pt is also here for a medication refill of her clobetasol cream as well. Pt accidentally stepped on the box fan plug between her toes and she is experiencing mild swelling and burning. Pt is also having numbness up her left leg that she thinks is her sciatica recurring. She states that she is having associated symptoms of dysuria, frequency, bruising, swelling, and burning of the left toes. She states that she has tried tennis ball method for circulation with mild relief for her symptoms. Pt has also tried alka-seltzer for her nasal symptoms. She denies hematuria,  and any other symptoms. Pt works as a Forensic psychologist.   Patient Active Problem List   Diagnosis Date Noted   Insomnia 02/26/2012   Past Medical History  Diagnosis Date   Allergy    Insomnia    History reviewed. No pertinent past surgical history. Allergies  Allergen Reactions   Prednisone Swelling    Of face   Prior to Admission medications   Medication Sig Start Date End Date Taking? Authorizing Provider  clobetasol cream (TEMOVATE) 8.85 % Apply 1 application topically 2 (two) times daily. 11/23/13  Yes Robyn Haber, MD  fluticasone (FLONASE) 50 MCG/ACT nasal spray USE 1 TO 2 SPRAYS IN EACH NOSTRIL EVERY DAY.  "OV NEEDED FOR ADDITIONAL REFILLS" 10/12/14  Yes Chelle Jeffery, PA-C  hydrocortisone (ANUSOL-HC) 2.5 % rectal cream Place 1 application rectally 2 (two) times daily. 04/29/14  Yes Roselee Culver, MD  LORazepam  (ATIVAN) 0.5 MG tablet Take 1 tablet (0.5 mg total) by mouth at bedtime. 08/27/14  Yes Robyn Haber, MD      Review of Systems  Constitutional: Negative for fever and chills.  HENT: Positive for congestion.   Genitourinary: Positive for dysuria and frequency. Negative for difficulty urinating.       Objective:   Physical Exam  Constitutional: She is oriented to person, place, and time. She appears well-developed and well-nourished. No distress.  HENT:  Head: Normocephalic and atraumatic.  Eyes: EOM are normal.  Neck: Neck supple. No tracheal deviation present.  Cardiovascular: Normal rate.   Pulmonary/Chest: Effort normal. No respiratory distress.  Musculoskeletal: Normal range of motion.  Neurological: She is alert and oriented to person, place, and time.  Skin: Skin is warm and dry.  Psychiatric: She has a normal mood and affect. Her behavior is normal.  Nursing note and vitals reviewed.        BP 126/74 mmHg   Pulse 77   Temp(Src) 98 F (36.7 C) (Oral)   Resp 18   Ht 5\' 4"  (1.626 m)   Wt 163 lb (73.936 kg)   BMI 27.97 kg/m2   SpO2 98%  Assessment & Plan:  DIAGNOSTIC STUDIES: Oxygen Saturation is 98% on RA, nl by my interpretation.    COORDINATION OF CARE: 11:12 AM-Discussed treatment plan which includes 104 with pt at bedside and pt agreed to plan.  This chart was scribed in my presence and reviewed by me personally.    ICD-9-CM ICD-10-CM   1. Frequent urination 788.41 R35.0 POCT urinalysis dipstick     POCT UA - Microscopic Only     Urine culture  2. Insomnia 780.52 G47.00 LORazepam (ATIVAN) 0.5 MG tablet  3. Recurrent maxillary sinusitis, unspecified chronicity 461.0 J01.01 amoxicillin-clavulanate (AUGMENTIN) 875-125 MG per tablet     fluconazole (DIFLUCAN) 150 MG tablet  4. 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 Lichen sclerosus et atrophicus 701.0 L90.0      Signed, Robyn Haber, MD

## 2014-10-29 NOTE — Patient Instructions (Addendum)
Insomnia Insomnia is frequent trouble falling and/or staying asleep. Insomnia can be a long term problem or a short term problem. Both are common. Insomnia can be a short term problem when the wakefulness is related to a certain stress or worry. Long term insomnia is often related to ongoing stress during waking hours and/or poor sleeping habits. Overtime, sleep deprivation itself can make the problem worse. Every little thing feels more severe because you are overtired and your ability to cope is decreased. CAUSES   Stress, anxiety, and depression.  Poor sleeping habits.  Distractions such as TV in the bedroom.  Naps close to bedtime.  Engaging in emotionally charged conversations before bed.  Technical reading before sleep.  Alcohol and other sedatives. They may make the problem worse. They can hurt normal sleep patterns and normal dream activity.  Stimulants such as caffeine for several hours prior to bedtime.  Pain syndromes and shortness of breath can cause insomnia.  Exercise late at night.  Changing time zones may cause sleeping problems (jet lag). It is sometimes helpful to have someone observe your sleeping patterns. They should look for periods of not breathing during the night (sleep apnea). They should also look to see how long those periods last. If you live alone or observers are uncertain, you can also be observed at a sleep clinic where your sleep patterns will be professionally monitored. Sleep apnea requires a checkup and treatment. Give your caregivers your medical history. Give your caregivers observations your family has made about your sleep.  SYMPTOMS   Not feeling rested in the morning.  Anxiety and restlessness at bedtime.  Difficulty falling and staying asleep. TREATMENT   Your caregiver may prescribe treatment for an underlying medical disorders. Your caregiver can give advice or help if you are using alcohol or other drugs for self-medication.  Treatment of underlying problems will usually eliminate insomnia problems.  Medications can be prescribed for short time use. They are generally not recommended for lengthy use.  Over-the-counter sleep medicines are not recommended for lengthy use. They can be habit forming.  You can promote easier sleeping by making lifestyle changes such as:  Using relaxation techniques that help with breathing and reduce muscle tension.  Exercising earlier in the day.  Changing your diet and the time of your last meal. No night time snacks.  Establish a regular time to go to bed.  Counseling can help with stressful problems and worry.  Soothing music and white noise may be helpful if there are background noises you cannot remove.  Stop tedious detailed work at least one hour before bedtime. HOME CARE INSTRUCTIONS   Keep a diary. Inform your caregiver about your progress. This includes any medication side effects. See your caregiver regularly. Take note of:  Times when you are asleep.  Times when you are awake during the night.  The quality of your sleep.  How you feel the next day. This information will help your caregiver care for you.  Get out of bed if you are still awake after 15 minutes. Read or do some quiet activity. Keep the lights down. Wait until you feel sleepy and go back to bed.  Keep regular sleeping and waking hours. Avoid naps.  Exercise regularly.  Avoid distractions at bedtime. Distractions include watching television or engaging in any intense or detailed activity like attempting to balance the household checkbook.  Develop a bedtime ritual. Keep a familiar routine of bathing, brushing your teeth, climbing into bed at the same  time each night, listening to soothing music. Routines increase the success of falling to sleep faster.  Use relaxation techniques. This can be using breathing and muscle tension release routines. It can also include visualizing peaceful scenes.  You can also help control troubling or intruding thoughts by keeping your mind occupied with boring or repetitive thoughts like the old concept of counting sheep. You can make it more creative like imagining planting one beautiful flower after another in your backyard garden.  During your day, work to eliminate stress. When this is not possible use some of the previous suggestions to help reduce the anxiety that accompanies stressful situations. MAKE SURE YOU:   Understand these instructions.  Will watch your condition.  Will get help right away if you are not doing well or get worse. Document Released: 05/25/2000 Document Revised: 08/20/2011 Document Reviewed: 06/25/2007 [facility] Patient Information 2015 [place], [place]. This information is not intended to replace advice given to you by your health care provider. Make sure you discuss any questions you have with your health care provider. Morton's Neuroma Neuralgia (nerve pain) or neuroma (benign [non-cancerous] nerve tumor) may develop on any interdigital nerve. The interdigital nerves (nerves between digits) of the foot travel beneath and between the metatarsals (long bones of the fore foot) and pass the nerve endings to the toes. The third interdigital is a common place for a small neuroma to form called Morton's neuroma. Another nerve to be affected commonly is the fourth interdigital nerve. This would be in approximately in the area of the base or ball under the bottom of your fourth toe. This condition occurs more commonly in women and is usually on one side. It is usually first noticed by pain radiating (spreading) to the ball of the foot or to the toes. CAUSES The cause of interdigital neuralgia may be from low grade repetitive trauma (damage caused by an accident) as in activities causing a repeated pounding of the foot (running, jumping etc.). It is also caused by improper footwear or recent loss of the fatty padding on the bottom of the  foot. TREATMENT  The condition often resolves (goes away) simply with decreasing activity if that is thought to be the cause. Proper shoes are beneficial. Orthotics (special foot support aids) such as a metatarsal bar are often beneficial. This condition usually responds to conservative therapy, however if surgery is necessary it usually brings complete relief. HOME CARE INSTRUCTIONS   Apply ice to the area of soreness for 15-20 minutes, 03-04 times per day, while awake for the first 2 days. Put ice in a plastic bag and place a towel between the bag of ice and your skin.  Only take over-the-counter or prescription medicines for pain, discomfort, or fever as directed by your caregiver. MAKE SURE YOU:   Understand these instructions.  Will watch your condition.  Will get help right away if you are not doing well or get worse. Document Released: 09/03/2000 Document Revised: 08/20/2011 Document Reviewed: 07/29/2013 [facility] Patient Information 2015 [place], [place]. This information is not intended to replace advice given to you by your health care provider. Make sure you discuss any questions you have with your health care provider.

## 2014-10-30 LAB — URINE CULTURE: Colony Count: 7000

## 2014-12-01 ENCOUNTER — Ambulatory Visit (INDEPENDENT_AMBULATORY_CARE_PROVIDER_SITE_OTHER): Payer: Medicare Other

## 2014-12-01 ENCOUNTER — Encounter: Payer: Self-pay | Admitting: Podiatry

## 2014-12-01 ENCOUNTER — Ambulatory Visit (INDEPENDENT_AMBULATORY_CARE_PROVIDER_SITE_OTHER): Payer: Medicare Other | Admitting: Podiatry

## 2014-12-01 VITALS — BP 130/82 | HR 100 | Resp 15

## 2014-12-01 DIAGNOSIS — M779 Enthesopathy, unspecified: Secondary | ICD-10-CM | POA: Diagnosis not present

## 2014-12-01 DIAGNOSIS — D3610 Benign neoplasm of peripheral nerves and autonomic nervous system, unspecified: Secondary | ICD-10-CM

## 2014-12-01 MED ORDER — TRIAMCINOLONE ACETONIDE 10 MG/ML IJ SUSP
10.0000 mg | Freq: Once | INTRAMUSCULAR | Status: AC
  Administered 2014-12-01: 10 mg

## 2014-12-01 NOTE — Progress Notes (Signed)
   Subjective:    Patient ID: Brandy Leonard, female    DOB: 09-15-1945, 69 y.o.   MRN: [id number]  HPI Pt presents with s/s of mortons nueroma in her right foot between 3rd and 4th toes, pain presents on the dorsal and plantar region 3 months ago with swelling   Review of Systems  Musculoskeletal: Positive for myalgias.  All other systems reviewed and are negative.      Objective:   Physical Exam        Assessment & Plan:

## 2014-12-01 NOTE — Progress Notes (Signed)
Subjective:     Patient ID: Brandy Leonard, female   DOB: 06/22/1945, 69 y.o.   MRN: [id number]  HPI patient presents stating I'm having a lot of pain in my forefoot right and I did have an injury that occurred at that time and it's been bothering me since. I've also had some back problems but that was after my foot problems   Review of Systems  All other systems reviewed and are negative.      Objective:   Physical Exam  Constitutional: She is oriented to person, place, and time.  Cardiovascular: Intact distal pulses.   Musculoskeletal: Normal range of motion.  Neurological: She is oriented to person, place, and time.  Skin: Skin is warm.  Nursing note and vitals reviewed.  neurovascular status found to be intact with muscle strength adequate range of motion within normal limits and patient is noted to have a painful third metatarsophalangeal joint right with inflammation and fluid buildup and is noted to have mild medial dislocation of the third toe itself. Patient has good digital perfusion and is well oriented 3     Assessment:     Inflammatory capsulitis third MPJ right secondary to trauma    Plan:     H&P performed and condition reviewed. At this time I did a proximal nerve block right I then went ahead and aspirated the joint getting out a small amount of clear fluid and injected with a quarter cc of dexamethasone Kenalog and applied thick plantar pad to reduce pressure

## 2014-12-11 ENCOUNTER — Other Ambulatory Visit: Payer: Self-pay | Admitting: Physician Assistant

## 2014-12-15 ENCOUNTER — Encounter: Payer: Self-pay | Admitting: Podiatry

## 2014-12-15 ENCOUNTER — Ambulatory Visit (INDEPENDENT_AMBULATORY_CARE_PROVIDER_SITE_OTHER): Payer: Medicare Other | Admitting: Podiatry

## 2014-12-15 VITALS — BP 121/79 | HR 90 | Resp 15

## 2014-12-15 DIAGNOSIS — M779 Enthesopathy, unspecified: Secondary | ICD-10-CM | POA: Diagnosis not present

## 2014-12-15 DIAGNOSIS — M2040 Other hammer toe(s) (acquired), unspecified foot: Secondary | ICD-10-CM

## 2014-12-15 NOTE — Progress Notes (Signed)
Subjective:     Patient ID: Brandy Leonard, female   DOB: [birth year], 69 y.o.   MRN: [id number]  HPI patient presents stating my pain is better than it was but I still have some swelling in my toes   Review of Systems     Objective:   Physical Exam Neurovascular status intact muscle strength adequate with continued inflammation in the third metatarsophalangeal joint that has improved but is still present with also discomfort on the left foot occasionally upon palpation    Assessment:     Inflammatory capsulitis with possibility of a irritation of the capsule or possible stretch of the plantar capsule third MPJ secondary to injury that occurred    Plan:     Explained chronic nature of condition and reviewed inflammatory capsulitis and shoe gear modifications. Patient is scanned for custom orthotic devices

## 2015-01-05 ENCOUNTER — Ambulatory Visit: Payer: Medicare Other | Admitting: *Deleted

## 2015-01-05 DIAGNOSIS — M779 Enthesopathy, unspecified: Secondary | ICD-10-CM

## 2015-01-05 NOTE — Progress Notes (Signed)
Patient ID: Brandy Leonard, female   DOB: Aug 27, 1945, 69 y.o.   MRN: [id number] Patient presents for orthotic pick up.  Verbal and written break in and wear instructions given.  Patient will follow up in 4 weeks if symptoms worsen or fail to improve.

## 2015-01-05 NOTE — Patient Instructions (Signed)

## 2015-01-17 ENCOUNTER — Encounter: Payer: Self-pay | Admitting: Family Medicine

## 2015-01-17 ENCOUNTER — Ambulatory Visit (INDEPENDENT_AMBULATORY_CARE_PROVIDER_SITE_OTHER): Payer: Medicare Other | Admitting: Family Medicine

## 2015-01-17 VITALS — BP 130/87 | HR 79 | Temp 97.8°F | Resp 16 | Ht 64.25 in | Wt 170.4 lb

## 2015-01-17 DIAGNOSIS — Z131 Encounter for screening for diabetes mellitus: Secondary | ICD-10-CM

## 2015-01-17 DIAGNOSIS — K640 First degree hemorrhoids: Secondary | ICD-10-CM

## 2015-01-17 DIAGNOSIS — N904 Leukoplakia of vulva: Secondary | ICD-10-CM | POA: Diagnosis not present

## 2015-01-17 DIAGNOSIS — Z1329 Encounter for screening for other suspected endocrine disorder: Secondary | ICD-10-CM | POA: Diagnosis not present

## 2015-01-17 DIAGNOSIS — Z23 Encounter for immunization: Secondary | ICD-10-CM

## 2015-01-17 DIAGNOSIS — Z0000 Encounter for general adult medical examination without abnormal findings: Secondary | ICD-10-CM

## 2015-01-17 DIAGNOSIS — R739 Hyperglycemia, unspecified: Secondary | ICD-10-CM | POA: Diagnosis not present

## 2015-01-17 DIAGNOSIS — G4700 Insomnia, unspecified: Secondary | ICD-10-CM | POA: Diagnosis not present

## 2015-01-17 DIAGNOSIS — J301 Allergic rhinitis due to pollen: Secondary | ICD-10-CM

## 2015-01-17 DIAGNOSIS — R7309 Other abnormal glucose: Secondary | ICD-10-CM | POA: Diagnosis not present

## 2015-01-17 LAB — COMPREHENSIVE METABOLIC PANEL
ALT: 14 U/L (ref 6–29)
AST: 15 U/L (ref 10–35)
Albumin: 4.1 g/dL (ref 3.6–5.1)
Alkaline Phosphatase: 100 U/L (ref 33–130)
BUN: 13 mg/dL (ref 7–25)
CO2: 23 mmol/L (ref 20–31)
Calcium: 8.9 mg/dL (ref 8.6–10.4)
Chloride: 104 mmol/L (ref 98–110)
Creat: 0.56 mg/dL (ref 0.50–0.99)
Glucose, Bld: 92 mg/dL (ref 65–99)
Potassium: 4.2 mmol/L (ref 3.5–5.3)
Sodium: 139 mmol/L (ref 135–146)
Total Bilirubin: 0.5 mg/dL (ref 0.2–1.2)
Total Protein: 6.9 g/dL (ref 6.1–8.1)

## 2015-01-17 LAB — HEMOGLOBIN A1C
Hgb A1c MFr Bld: 5.6 % (ref <5.7)
Mean Plasma Glucose: 114 mg/dL (ref <117)

## 2015-01-17 MED ORDER — LORAZEPAM 0.5 MG PO TABS: 0.5000 mg | ORAL_TABLET | Freq: Every day | ORAL | Status: DC

## 2015-01-17 MED ORDER — HYDROCORTISONE ACETATE 25 MG RE SUPP: 25.0000 mg | Freq: Two times a day (BID) | RECTAL | Status: DC

## 2015-01-17 NOTE — Progress Notes (Signed)
Subjective:    Patient ID: Brandy Leonard, female    DOB: 12/24/1945, 69 y.o.   MRN: [id number]  01/17/2015  Annual Exam   HPI This 69 y.o. female presents for Annual Wellness Examination.  Last physical:  11-23-2013 Pap smear:  11-23-2013 [facility] Mammogram:  06-10-2014 [facility] Colonoscopy:  2014; Collene Mares; polyps.  Repeat 5 years. Bone density:  06-10-2014 [facility] TDAP: unsure Pneumovax:  2014;  Zostavax: [facility] 2014 Influenza:  2015 Eye exam:  Every six months; macular degeneration. Dental exam:  [facility]; every six months.  Toe swelling and inflammation: s/p injection; insoles.   Lichen sclerosis:  Followed by Ronita Hipps.  Hemorrhoids: anusol HC suppositories.    Insomnia: takes Lorazepam 1 qhs for insomnia.   Review of Systems  Constitutional: Negative for fever, chills, diaphoresis, activity change, appetite change, fatigue and unexpected weight change.  HENT: Negative for congestion, dental problem, drooling, ear discharge, ear pain, facial swelling, hearing loss, mouth sores, nosebleeds, postnasal drip, rhinorrhea, sinus pressure, sneezing, sore throat, tinnitus, trouble swallowing and voice change.   Eyes: Negative for photophobia, pain, discharge, redness, itching and visual disturbance.  Respiratory: Negative for apnea, cough, choking, chest tightness, shortness of breath, wheezing and stridor.   Cardiovascular: Negative for chest pain, palpitations and leg swelling.  Gastrointestinal: Negative for nausea, vomiting, abdominal pain, diarrhea, constipation, blood in stool, abdominal distention, anal bleeding and rectal pain.  Endocrine: Negative for cold intolerance, heat intolerance, polydipsia, polyphagia and polyuria.  Genitourinary: Negative for dysuria, urgency, frequency, hematuria, flank pain, decreased urine volume, vaginal bleeding, vaginal discharge, enuresis, difficulty urinating, genital sores, vaginal pain, menstrual problem, pelvic pain and dyspareunia.    Musculoskeletal: Negative for myalgias, back pain, joint swelling, arthralgias, gait problem, neck pain and neck stiffness.  Skin: Negative for color change, pallor, rash and wound.  Allergic/Immunologic: Negative for environmental allergies, food allergies and immunocompromised state.  Neurological: Negative for dizziness, tremors, seizures, syncope, facial asymmetry, speech difficulty, weakness, light-headedness, numbness and headaches.  Hematological: Negative for adenopathy. Does not bruise/bleed easily.  Psychiatric/Behavioral: Negative for suicidal ideas, hallucinations, behavioral problems, confusion, sleep disturbance, self-injury, dysphoric mood, decreased concentration and agitation. The patient is not nervous/anxious and is not hyperactive.     Past Medical History  Diagnosis Date  . Insomnia   . Allergy     Benadryl, Flonase   No past surgical history on file. Allergies  Allergen Reactions  . Prednisone Swelling    Of face   Current Outpatient Prescriptions  Medication Sig Dispense Refill  . clobetasol cream (TEMOVATE) 8.91 % Apply 1 application topically 2 (two) times daily. 30 g 11  . fluticasone (FLONASE) 50 MCG/ACT nasal spray USE 1 TO 2 SPRAYS IN EACH NOSTRIL EVERY DAY (OFFICE VISIT NEEDED FOR ADDITIONAL REFILLS) 48 g 2  . hydrocortisone (ANUSOL-HC) 2.5 % rectal cream Place 1 application rectally 2 (two) times daily. 30 g 0  . LORazepam (ATIVAN) 0.5 MG tablet Take 1-2 tablets (0.5-1 mg total) by mouth at bedtime. 100 tablet 1  . hydrocortisone (ANUSOL-HC) 25 MG suppository Place 1 suppository (25 mg total) rectally 2 (two) times daily. 12 suppository 1   No current facility-administered medications for this visit.   Social History   Social History  . Marital Status: Divorced    Spouse Name: N/A  . Number of Children: N/A  . Years of Education: N/A   Occupational History  . Not on file.   Social History Main Topics  . Smoking status: Former Research scientist (life sciences)  .  Smokeless  tobacco: Not on file  . Alcohol Use: 1.2 oz/week    2 Glasses of wine per week  . Drug Use: No  . Sexual Activity: No   Other Topics Concern  . Not on file   Social History Narrative   Marital status: divorced; not dating; not interested.      Children:  None      Lives: alone      Employment:  Realtor full time.      Tobacco:  Quit smoking 1999.  Social smoker.       Alcohol:  Wine two nights per week.       Exercise:  Sporadic exercise.  Treadmill      Advanced Directive: +living will; HCPOA: brother Duffy Rhody). FULL CODE no prolonged measures.        Seatbelt:  100%      ADLs: independent with ADLs.       Family History  Problem Relation Age of Onset  . Hypertension Mother   . Heart disease Mother   . Thyroid disease Father   . Cancer Father 50    Bladder cancer  . Hypertension Brother   . Stroke Maternal Grandmother   . Cancer Maternal Grandfather        Objective:    BP 130/87 mmHg  Pulse 79  Temp(Src) 97.8 F (36.6 C) (Oral)  Resp 16  Ht 5' 4.25" (1.632 m)  Wt 170 lb 6.4 oz (77.293 kg)  BMI 29.02 kg/m2  SpO2 97% Physical Exam  Constitutional: She is oriented to person, place, and time. She appears well-developed and well-nourished. No distress.  HENT:  Head: Normocephalic and atraumatic.  Right Ear: External ear normal.  Left Ear: External ear normal.  Nose: Nose normal.  Mouth/Throat: Oropharynx is clear and moist.  Eyes: Conjunctivae and EOM are normal. Pupils are equal, round, and reactive to light.  Neck: Normal range of motion and full passive range of motion without pain. Neck supple. No JVD present. Carotid bruit is not present. No thyromegaly present.  Cardiovascular: Normal rate, regular rhythm and normal heart sounds.  Exam reveals no gallop and no friction rub.   No murmur heard. Pulmonary/Chest: Effort normal and breath sounds normal. She has no wheezes. She has no rales.  Abdominal: Soft. Bowel sounds are normal. She exhibits  no distension and no mass. There is no tenderness. There is no rebound and no guarding.  Musculoskeletal:       Right shoulder: Normal.       Left shoulder: Normal.       Cervical back: Normal.  Lymphadenopathy:    She has no cervical adenopathy.  Neurological: She is alert and oriented to person, place, and time. She has normal reflexes. No cranial nerve deficit. She exhibits normal muscle tone. Coordination normal.  Skin: Skin is warm and dry. No rash noted. She is not diaphoretic. No erythema. No pallor.  Psychiatric: She has a normal mood and affect. Her behavior is normal. Judgment and thought content normal.  Nursing note and vitals reviewed.  PREVNAR 13 ADMINISTERED.     Assessment & Plan:   1. Medicare annual wellness visit, initial   2. Screening for diabetes mellitus   3. Hyperglycemia   4. Insomnia   5. Need for prophylactic vaccination against Streptococcus pneumoniae (pneumococcus)   6. Screening for thyroid disorder     1. Medicare Annual Wellness Examination, Initial: anticipatory guidance --- exercise, weight loss, ASA 81mg  daily, Calcium 1200mg  daily. Pap smear and mammogram  UTD.  Bone density UTD. Colonoscopy UTD. Immunizations reviewed; s/p Prevnar 13.  Independent with ADLs. No evidence of depression. Low fall risk.  No hearing loss. No urinary incontinence.  Has advanced directives; FULL CODE; no prolonged measures; has HCPOA. 2.  Screening DMII: Obtain glucose. 3.  Hyperglycemia: New; present last year; obtain HgbA1c. 4.  Insomnia; controlled with Lorazepam qhs; refill provided. 5. Screening thyroid disease: obtain TSH per patient request; aware that Medicare will not cover cost of test. 6.  S/p Prevnar 13. 7. Hemorrhoids: stable; refill of Anusol HC provided. 8. Lichen sclerosis: stable; followed by gynecology. 9. Allergic Rhinitis: stable; continue Flonase.   Orders Placed This Encounter  Procedures  . Pneumococcal conjugate vaccine 13-valent IM  .  Comprehensive metabolic panel    Order Specific Question:  Has the patient fasted?    Answer:  Yes  . Hemoglobin A1c  . TSH    Standing Status: Future     Number of Occurrences:      Standing Expiration Date: 01/26/2016    Meds ordered this encounter  Medications  . DISCONTD: LORazepam (ATIVAN) 0.5 MG tablet    Sig: Take 1-2 tablets (0.5-1 mg total) by mouth at bedtime.    Dispense:  100 tablet    Refill:  1  . DISCONTD: hydrocortisone (ANUSOL-HC) 25 MG suppository    Sig: Place 1 suppository (25 mg total) rectally 2 (two) times daily.    Dispense:  12 suppository    Refill:  1  . hydrocortisone (ANUSOL-HC) 25 MG suppository    Sig: Place 1 suppository (25 mg total) rectally 2 (two) times daily.    Dispense:  12 suppository    Refill:  1  . LORazepam (ATIVAN) 0.5 MG tablet    Sig: Take 1-2 tablets (0.5-1 mg total) by mouth at bedtime.    Dispense:  100 tablet    Refill:  1    Return in about 1 year (around 01/17/2016) for complete physical examiniation.   Brandilee Pies Elayne Guerin, M.D. [facility] [address] [place], [place]  [address] [phone] phone [phone] fax

## 2015-01-17 NOTE — Patient Instructions (Signed)

## 2015-01-18 ENCOUNTER — Other Ambulatory Visit: Payer: Self-pay

## 2015-01-18 NOTE — Telephone Encounter (Signed)
Both Lorazepam and Anusol Rxs printed out at [place], but not given to pt or signed. I called them both into [facility] which is pharm set in [facility] at [place].

## 2015-01-19 ENCOUNTER — Telehealth: Payer: Self-pay

## 2015-01-19 NOTE — Telephone Encounter (Signed)
Bariatric clinic called. They faxed over a release for labs. Faxed labs to them

## 2015-01-20 ENCOUNTER — Encounter: Payer: Self-pay | Admitting: Family Medicine

## 2015-02-03 DIAGNOSIS — K5900 Constipation, unspecified: Secondary | ICD-10-CM | POA: Diagnosis not present

## 2015-02-03 DIAGNOSIS — K644 Residual hemorrhoidal skin tags: Secondary | ICD-10-CM | POA: Diagnosis not present

## 2015-02-08 ENCOUNTER — Encounter: Payer: Self-pay | Admitting: Family Medicine

## 2015-02-08 DIAGNOSIS — J301 Allergic rhinitis due to pollen: Secondary | ICD-10-CM | POA: Insufficient documentation

## 2015-02-08 DIAGNOSIS — K640 First degree hemorrhoids: Secondary | ICD-10-CM | POA: Insufficient documentation

## 2015-02-08 DIAGNOSIS — N904 Leukoplakia of vulva: Secondary | ICD-10-CM | POA: Insufficient documentation

## 2015-02-17 DIAGNOSIS — R194 Change in bowel habit: Secondary | ICD-10-CM | POA: Diagnosis not present

## 2015-02-17 DIAGNOSIS — K625 Hemorrhage of anus and rectum: Secondary | ICD-10-CM | POA: Diagnosis not present

## 2015-02-17 DIAGNOSIS — K601 Chronic anal fissure: Secondary | ICD-10-CM | POA: Diagnosis not present

## 2015-04-04 ENCOUNTER — Ambulatory Visit (INDEPENDENT_AMBULATORY_CARE_PROVIDER_SITE_OTHER): Payer: Medicare Other

## 2015-04-04 ENCOUNTER — Ambulatory Visit (INDEPENDENT_AMBULATORY_CARE_PROVIDER_SITE_OTHER): Payer: Medicare Other | Admitting: Internal Medicine

## 2015-04-04 VITALS — BP 130/80 | HR 108 | Temp 98.2°F | Resp 18 | Ht 64.25 in | Wt 160.0 lb

## 2015-04-04 DIAGNOSIS — R000 Tachycardia, unspecified: Secondary | ICD-10-CM

## 2015-04-04 DIAGNOSIS — R072 Precordial pain: Secondary | ICD-10-CM | POA: Diagnosis not present

## 2015-04-04 DIAGNOSIS — R05 Cough: Secondary | ICD-10-CM

## 2015-04-04 DIAGNOSIS — R61 Generalized hyperhidrosis: Secondary | ICD-10-CM | POA: Diagnosis not present

## 2015-04-04 DIAGNOSIS — R059 Cough, unspecified: Secondary | ICD-10-CM

## 2015-04-04 DIAGNOSIS — J9801 Acute bronchospasm: Secondary | ICD-10-CM | POA: Diagnosis not present

## 2015-04-04 LAB — POCT CBC
Granulocyte percent: 72.1 %G (ref 37–80)
HCT, POC: 39 % (ref 37.7–47.9)
Hemoglobin: 13.1 g/dL (ref 12.2–16.2)
Lymph, poc: 1.9 (ref 0.6–3.4)
MCH, POC: 27.4 pg (ref 27–31.2)
MCHC: 33.5 g/dL (ref 31.8–35.4)
MCV: 81.8 fL (ref 80–97)
MID (cbc): 0.5 (ref 0–0.9)
MPV: 7.6 fL (ref 0–99.8)
POC Granulocyte: 6.3 (ref 2–6.9)
POC LYMPH PERCENT: 22.2 %L (ref 10–50)
POC MID %: 5.7 %M (ref 0–12)
Platelet Count, POC: 257 10*3/uL (ref 142–424)
RBC: 4.77 M/uL (ref 4.04–5.48)
RDW, POC: 13.1 %
WBC: 8.7 10*3/uL (ref 4.6–10.2)

## 2015-04-04 MED ORDER — AZITHROMYCIN 500 MG PO TABS: 500.0000 mg | ORAL_TABLET | Freq: Every day | ORAL | Status: DC

## 2015-04-04 MED ORDER — HYDROCODONE-ACETAMINOPHEN 7.5-325 MG/15ML PO SOLN: 5.0000 mL | ORAL | Status: DC | PRN

## 2015-04-04 MED ORDER — ALBUTEROL SULFATE HFA 108 (90 BASE) MCG/ACT IN AERS
2.0000 | INHALATION_SPRAY | Freq: Four times a day (QID) | RESPIRATORY_TRACT | Status: DC | PRN
Start: 2015-04-04 — End: 2016-04-04

## 2015-04-04 NOTE — Patient Instructions (Addendum)
Gastroesophageal Reflux Disease, Adult Normally, food travels down the esophagus and stays in the stomach to be digested. However, when a person has gastroesophageal reflux disease (GERD), food and stomach acid move back up into the esophagus. When this happens, the esophagus becomes sore and inflamed. Over time, GERD can create small holes (ulcers) in the lining of the esophagus.  CAUSES This condition is caused by a problem with the muscle between the esophagus and the stomach (lower esophageal sphincter, or LES). Normally, the LES muscle closes after food passes through the esophagus to the stomach. When the LES is weakened or abnormal, it does not close properly, and that allows food and stomach acid to go back up into the esophagus. The LES can be weakened by certain dietary substances, medicines, and medical conditions, including:  Tobacco use.  Pregnancy.  Having a hiatal hernia.  Heavy alcohol use.  Certain foods and beverages, such as coffee, chocolate, onions, and peppermint. RISK FACTORS This condition is more likely to develop in:  People who have an increased body weight.  People who have connective tissue disorders.  People who use NSAID medicines. SYMPTOMS Symptoms of this condition include:  Heartburn.  Difficult or painful swallowing.  The feeling of having a lump in the throat.  Abitter taste in the mouth.  Bad breath.  Having a large amount of saliva.  Having an upset or bloated stomach.  Belching.  Chest pain.  Shortness of breath or wheezing.  Ongoing (chronic) cough or a night-time cough.  Wearing away of tooth enamel.  Weight loss. Different conditions can cause chest pain. Make sure to see your health care provider if you experience chest pain. DIAGNOSIS Your health care provider will take a medical history and perform a physical exam. To determine if you have mild or severe GERD, your health care provider may also monitor how you respond  to treatment. You may also have other tests, including:  An endoscopy toexamine your stomach and esophagus with a small camera.  A test thatmeasures the acidity level in your esophagus.  A test thatmeasures how much pressure is on your esophagus.  A barium swallow or modified barium swallow to show the shape, size, and functioning of your esophagus. TREATMENT The goal of treatment is to help relieve your symptoms and to prevent complications. Treatment for this condition may vary depending on how severe your symptoms are. Your health care provider may recommend:  Changes to your diet.  Medicine.  Surgery. HOME CARE INSTRUCTIONS Diet  Follow a diet as recommended by your health care provider. This may involve avoiding foods and drinks such as:  Coffee and tea (with or without caffeine).  Drinks that containalcohol.  Energy drinks and sports drinks.  Carbonated drinks or sodas.  Chocolate and cocoa.  Peppermint and mint flavorings.  Garlic and onions.  Horseradish.  Spicy and acidic foods, including peppers, chili powder, curry powder, vinegar, hot sauces, and barbecue sauce.  Citrus fruit juices and citrus fruits, such as oranges, lemons, and limes.  Tomato-based foods, such as red sauce, chili, salsa, and pizza with red sauce.  Fried and fatty foods, such as donuts, french fries, potato chips, and high-fat dressings.  High-fat meats, such as hot dogs and fatty cuts of red and white meats, such as rib eye steak, sausage, ham, and bacon.  High-fat dairy items, such as whole milk, butter, and cream cheese.  Eat small, frequent meals instead of large meals.  Avoid drinking large amounts of liquid with your   meals.  Avoid eating meals during the 2-3 hours before bedtime.  Avoid lying down right after you eat.  Do not exercise right after you eat. General Instructions  Pay attention to any changes in your symptoms.  Take over-the-counter and prescription  medicines only as told by your health care provider. Do not take aspirin, ibuprofen, or other NSAIDs unless your health care provider told you to do so.  Do not use any tobacco products, including cigarettes, chewing tobacco, and e-cigarettes. If you need help quitting, ask your health care provider.  Wear loose-fitting clothing. Do not wear anything tight around your waist that causes pressure on your abdomen.  Raise (elevate) the head of your bed 6 inches (15cm).  Try to reduce your stress, such as with yoga or meditation. If you need help reducing stress, ask your health care provider.  If you are overweight, reduce your weight to an amount that is healthy for you. Ask your health care provider for guidance about a safe weight loss goal.  Keep all follow-up visits as told by your health care provider. This is important. SEEK MEDICAL CARE IF:  You have new symptoms.  You have unexplained weight loss.  You have difficulty swallowing, or it hurts to swallow.  You have wheezing or a persistent cough.  Your symptoms do not improve with treatment.  You have a hoarse voice. SEEK IMMEDIATE MEDICAL CARE IF:  You have pain in your arms, neck, jaw, teeth, or back.  You feel sweaty, dizzy, or light-headed.  You have chest pain or shortness of breath.  You vomit and your vomit looks like blood or coffee grounds.  You faint.  Your stool is bloody or black.  You cannot swallow, drink, or eat.   This information is not intended to replace advice given to you by your health care provider. Make sure you discuss any questions you have with your health care provider.   Document Released: 03/07/2005 Document Revised: 02/16/2015 Document Reviewed: 09/22/2014 Elsevier Interactive Patient Education 2016 [employer] Cough, Adult Coughing is a reflex that clears your throat and your airways. Coughing helps to heal and protect your lungs. It is normal to cough occasionally, but a cough that  happens with other symptoms or lasts a long time may be a sign of a condition that needs treatment. A cough may last only 2-3 weeks (acute), or it may last longer than 8 weeks (chronic). CAUSES Coughing is commonly caused by:  Breathing in substances that irritate your lungs.  A viral or bacterial respiratory infection.  Allergies.  Asthma.  Postnasal drip.  Smoking.  Acid backing up from the stomach into the esophagus (gastroesophageal reflux).  Certain medicines.  Chronic lung problems, including COPD (or rarely, lung cancer).  Other medical conditions such as heart failure. HOME CARE INSTRUCTIONS  Pay attention to any changes in your symptoms. Take these actions to help with your discomfort:  Take medicines only as told by your health care provider.  If you were prescribed an antibiotic medicine, take it as told by your health care provider. Do not stop taking the antibiotic even if you start to feel better.  Talk with your health care provider before you take a cough suppressant medicine.  Drink enough fluid to keep your urine clear or pale yellow.  If the air is dry, use a cold steam vaporizer or humidifier in your bedroom or your home to help loosen secretions.  Avoid anything that causes you to cough at work or  at home.  If your cough is worse at night, try sleeping in a semi-upright position.  Avoid cigarette smoke. If you smoke, quit smoking. If you need help quitting, ask your health care provider.  Avoid caffeine.  Avoid alcohol.  Rest as needed. SEEK MEDICAL CARE IF:   You have new symptoms.  You cough up pus.  Your cough does not get better after 2-3 weeks, or your cough gets worse.  You cannot control your cough with suppressant medicines and you are losing sleep.  You develop pain that is getting worse or pain that is not controlled with pain medicines.  You have a fever.  You have unexplained weight loss.  You have night sweats. SEEK  IMMEDIATE MEDICAL CARE IF:  You cough up blood.  You have difficulty breathing.  Your heartbeat is very fast.   This information is not intended to replace advice given to you by your health care provider. Make sure you discuss any questions you have with your health care provider.   Document Released: 11/24/2010 Document Revised: 02/16/2015 Document Reviewed: 08/04/2014 Elsevier Interactive Patient Education 2016 [employer] Inc[employer] Nonspecific Chest Pain  Chest pain can be caused by many different conditions. There is always a chance that your pain could be related to something serious, such as a heart attack or a blood clot in your lungs. Chest pain can also be caused by conditions that are not life-threatening. If you have chest pain, it is very important to follow up with your health care provider. CAUSES  Chest pain can be caused by:  Heartburn.  Pneumonia or bronchitis.  Anxiety or stress.  Inflammation around your heart (pericarditis) or lung (pleuritis or pleurisy).  A blood clot in your lung.  A collapsed lung (pneumothorax). It can develop suddenly on its own (spontaneous pneumothorax) or from trauma to the chest.  Shingles infection (varicella-zoster virus).  Heart attack.  Damage to the bones, muscles, and cartilage that make up your chest wall. This can include:  Bruised bones due to injury.  Strained muscles or cartilage due to frequent or repeated coughing or overwork.  Fracture to one or more ribs.  Sore cartilage due to inflammation (costochondritis). RISK FACTORS  Risk factors for chest pain may include:  Activities that increase your risk for trauma or injury to your chest.  Respiratory infections or conditions that cause frequent coughing.  Medical conditions or overeating that can cause heartburn.  Heart disease or family history of heart disease.  Conditions or health behaviors that increase your risk of developing a blood clot.  Having had  chicken pox (varicella zoster). SIGNS AND SYMPTOMS Chest pain can feel like:  Burning or tingling on the surface of your chest or deep in your chest.  Crushing, pressure, aching, or squeezing pain.  Dull or sharp pain that is worse when you move, cough, or take a deep breath.  Pain that is also felt in your back, neck, shoulder, or arm, or pain that spreads to any of these areas. Your chest pain may come and go, or it may stay constant. DIAGNOSIS Lab tests or other studies may be needed to find the cause of your pain. Your health care provider may have you take a test called an ambulatory ECG (electrocardiogram). An ECG records your heartbeat patterns at the time the test is performed. You may also have other tests, such as:  Transthoracic echocardiogram (TTE). During echocardiography, sound waves are used to create a picture of all of the heart structures  and to look at how blood flows through your heart.  Transesophageal echocardiogram (TEE).This is a more advanced imaging test that obtains images from inside your body. It allows your health care provider to see your heart in finer detail.  Cardiac monitoring. This allows your health care provider to monitor your heart rate and rhythm in real time.  Holter monitor. This is a portable device that records your heartbeat and can help to diagnose abnormal heartbeats. It allows your health care provider to track your heart activity for several days, if needed.  Stress tests. These can be done through exercise or by taking medicine that makes your heart beat more quickly.  Blood tests.  Imaging tests. TREATMENT  Your treatment depends on what is causing your chest pain. Treatment may include:  Medicines. These may include:  Acid blockers for heartburn.  Anti-inflammatory medicine.  Pain medicine for inflammatory conditions.  Antibiotic medicine, if an infection is present.  Medicines to dissolve blood clots.  Medicines to  treat coronary artery disease.  Supportive care for conditions that do not require medicines. This may include:  Resting.  Applying heat or cold packs to injured areas.  Limiting activities until pain decreases. HOME CARE INSTRUCTIONS  If you were prescribed an antibiotic medicine, finish it all even if you start to feel better.  Avoid any activities that bring on chest pain.  Do not use any tobacco products, including cigarettes, chewing tobacco, or electronic cigarettes. If you need help quitting, ask your health care provider.  Do not drink alcohol.  Take medicines only as directed by your health care provider.  Keep all follow-up visits as directed by your health care provider. This is important. This includes any further testing if your chest pain does not go away.  If heartburn is the cause for your chest pain, you may be told to keep your head raised (elevated) while sleeping. This reduces the chance that acid will go from your stomach into your esophagus.  Make lifestyle changes as directed by your health care provider. These may include:  Getting regular exercise. Ask your health care provider to suggest some activities that are safe for you.  Eating a heart-healthy diet. A registered dietitian can help you to learn healthy eating options.  Maintaining a healthy weight.  Managing diabetes, if necessary.  Reducing stress. SEEK MEDICAL CARE IF:  Your chest pain does not go away after treatment.  You have a rash with blisters on your chest.  You have a fever. SEEK IMMEDIATE MEDICAL CARE IF:   Your chest pain is worse.  You have an increasing cough, or you cough up blood.  You have severe abdominal pain.  You have severe weakness. You faint.Bronchospasm, Adult A bronchospasm is a spasm or tightening of the airways going into the lungs. During a bronchospasm breathing becomes more difficult because the airways get smaller. When this happens there can be  coughing, a whistling sound when breathing (wheezing), and difficulty breathing. Bronchospasm is often associated with asthma, but not all patients who experience a bronchospasm have asthma. CAUSES  A bronchospasm is caused by inflammation or irritation of the airways. The inflammation or irritation may be triggered by:   Allergies (such as to animals, pollen, food, or mold). Allergens that cause bronchospasm may cause wheezing immediately after exposure or many hours later.   Infection. Viral infections are believed to be the most common cause of bronchospasm.   Exercise.   Irritants (such as pollution, cigarette smoke, strong odors,  aerosol sprays, and paint fumes).   Weather changes. Winds increase molds and pollens in the air. Rain refreshes the air by washing irritants out. Cold air may cause inflammation.   Stress and emotional upset.  SIGNS AND SYMPTOMS   Wheezing.   Excessive nighttime coughing.   Frequent or severe coughing with a simple cold.   Chest tightness.   Shortness of breath.  DIAGNOSIS  Bronchospasm is usually diagnosed through a history and physical exam. Tests, such as chest X-rays, are sometimes done to look for other conditions. TREATMENT   Inhaled medicines can be given to open up your airways and help you breathe. The medicines can be given using either an inhaler or a nebulizer machine.  Corticosteroid medicines may be given for severe bronchospasm, usually when it is associated with asthma. HOME CARE INSTRUCTIONS   Always have a plan prepared for seeking medical care. Know when to call your health care provider and local emergency services (911 in the [place] Know where you can access local emergency care.  Only take medicines as directed by your health care provider.  If you were prescribed an inhaler or nebulizer machine, ask your health care provider to explain how to use it correctly. Always use a spacer with your inhaler if you were  given one.  It is necessary to remain calm during an attack. Try to relax and breathe more slowly.  Control your home environment in the following ways:   Change your heating and air conditioning filter at least once a month.   Limit your use of fireplaces and wood stoves.  Do not smoke and do not allow smoking in your home.   Avoid exposure to perfumes and fragrances.   Get rid of pests (such as roaches and mice) and their droppings.   Throw away plants if you see mold on them.   Keep your house clean and dust free.   Replace carpet with wood, tile, or vinyl flooring. Carpet can trap dander and dust.   Use allergy-proof pillows, mattress covers, and box spring covers.   Wash bed sheets and blankets every week in hot water and dry them in a dryer.   Use blankets that are made of polyester or cotton.   Wash hands frequently. SEEK MEDICAL CARE IF:   You have muscle aches.   You have chest pain.   The sputum changes from clear or white to yellow, green, gray, or bloody.   The sputum you cough up gets thicker.   There are problems that may be related to the medicine you are given, such as a rash, itching, swelling, or trouble breathing.  SEEK IMMEDIATE MEDICAL CARE IF:   You have worsening wheezing and coughing even after taking your prescribed medicines.   You have increased difficulty breathing.   You develop severe chest pain. MAKE SURE YOU:   Understand these instructions.  Will watch your condition.  Will get help right away if you are not doing well or get worse.   This information is not intended to replace advice given to you by your health care provider. Make sure you discuss any questions you have with your health care provider.   Document Released: 05/31/2003 Document Revised: 06/18/2014 Document Reviewed: 11/17/2012 Elsevier Interactive Patient Education 2016 [place] have chills.  You have sudden, unexplained  chest discomfort.  You have sudden, unexplained discomfort in your arms, back, neck, or jaw.  You have shortness of breath at any time.  You suddenly start to sweat, or your skin gets clammy.  You feel nauseous or you vomit.  You suddenly feel light-headed or dizzy.  Your heart begins to beat quickly, or it feels like it is skipping beats. These symptoms may represent a serious problem that is an emergency. Do not wait to see if the symptoms will go away. Get medical help right away. Call your local emergency services (911 in the [place] Do not drive yourself to the hospital.   This information is not intended to replace advice given to you by your health care provider. Make sure you discuss any questions you have with your health care provider.   Document Released: 03/07/2005 Document Revised: 06/18/2014 Document Reviewed: 01/01/2014 Elsevier Interactive Patient Education [employer].

## 2015-04-04 NOTE — Progress Notes (Signed)
Patient ID: [name], female   DOB: 29-Apr-1946, 69 y.o.   MRN: [id number]   04/04/2015 at 1:10 PM  Brandy Leonard / DOB: 11-25-1945 / MRN: [id number]  Problem list reviewed and updated by me where necessary.   SUBJECTIVE  Brandy Leonard is a 69 y.o. well appearing female presenting for the chief complaint of cough, chest congestion, fatigue for 1 month. She has also recent substernal pressure and throat congestion. There is no fhx of CAD, she has no risk factors for angina or CAD. She has had nite sweats and days sweats and low grade cough persisting  No sob, loc, dizzy.She works full time..  Her chest feels tite and she hears wheeze but no hx of astma. She has no cp with exercise ofr brisl walking or stairs.   She  has a past medical history of Insomnia; Allergy; Lichen sclerosus of female genitalia; and Hemorrhoids.    Medications reviewed and updated by myself where necessary, and exist elsewhere in the encounter.   Brandy Leonard is allergic to prednisone. She  reports that she has quit smoking. She does not have any smokeless tobacco history on file. She reports that she drinks about 1.2 oz of alcohol per week. She reports that she does not use illicit drugs. She  reports that she does not engage in sexual activity. The patient  has no past surgical history on file.  Her family history includes Cancer in her maternal grandfather; Cancer (age of onset: 66) in her father; Heart disease in her mother; Hypertension in her brother and mother; Stroke in her maternal grandmother; Thyroid disease in her father.  Review of Systems  Constitutional: Positive for chills, malaise/fatigue and diaphoresis. Negative for fever.  HENT: Positive for congestion. Negative for sore throat.   Eyes: Negative.   Respiratory: Positive for cough and sputum production. Negative for hemoptysis, shortness of breath and wheezing.   Cardiovascular: Positive for chest pain and palpitations. Negative for orthopnea  and PND.  Gastrointestinal: Negative for nausea and vomiting.  Skin: Negative for rash.  Neurological: Positive for weakness and headaches. Negative for dizziness, focal weakness and loss of consciousness.  Endo/Heme/Allergies: Positive for environmental allergies.  Psychiatric/Behavioral: Negative.     OBJECTIVE  Her  height is 5' 4.25" (1.632 m) and weight is 160 lb (72.576 kg). Her oral temperature is 98.2 F (36.8 C). Her blood pressure is 130/80 and her pulse is 108. Her respiration is 18 and oxygen saturation is 98%.  The patient's body mass index is 27.25 kg/(m^2).  Physical Exam  Constitutional: She is oriented to person, place, and time. She appears well-developed and well-nourished.  HENT:  Head: Normocephalic.  Right Ear: External ear normal.  Left Ear: External ear normal.  Nose: Mucosal edema, rhinorrhea and sinus tenderness present. Right sinus exhibits no maxillary sinus tenderness and no frontal sinus tenderness. Left sinus exhibits no maxillary sinus tenderness and no frontal sinus tenderness.  Mouth/Throat: Oropharynx is clear and moist.  Eyes: Conjunctivae and EOM are normal. No scleral icterus.  Neck: Normal range of motion. Neck supple.  Cardiovascular: Regular rhythm, S1 normal, S2 normal and normal heart sounds.   No extrasystoles are present. Tachycardia present.  Exam reveals no gallop.   No murmur heard. Respiratory: Effort normal. No tachypnea. No respiratory distress. She has no decreased breath sounds. She has wheezes. She has no rhonchi. She has no rales. She exhibits tenderness.  GI: She exhibits no distension. There is no tenderness.  Musculoskeletal: Normal  range of motion.  Lymphadenopathy:    She has no cervical adenopathy.  Neurological: She is alert and oriented to person, place, and time. Coordination normal.  Skin: Skin is warm and dry.  Psychiatric: She has a normal mood and affect. Her behavior is normal.   [facility] reading (PRIMARY) by   Dr.Derl Abalos.no infiltrate, normal cxr  EKG normal  Results for orders placed or performed in visit on 04/04/15 (from the past 24 hour(s))  POCT CBC     Status: None   Collection Time: 04/04/15  1:05 PM  Result Value Ref Range   WBC 8.7 4.6 - 10.2 K/uL   Lymph, poc 1.9 0.6 - 3.4   POC LYMPH PERCENT 22.2 10 - 50 %L   MID (cbc) 0.5 0 - 0.9   POC MID % 5.7 0 - 12 %M   POC Granulocyte 6.3 2 - 6.9   Granulocyte percent 72.1 37 - 80 %G   RBC 4.77 4.04 - 5.48 M/uL   Hemoglobin 13.1 12.2 - 16.2 g/dL   HCT, POC 39.0 37.7 - 47.9 %   MCV 81.8 80 - 97 fL   MCH, POC 27.4 27 - 31.2 pg   MCHC 33.5 31.8 - 35.4 g/dL   RDW, POC 13.1 %   Platelet Count, POC 257 142 - 424 K/uL   MPV 7.6 0 - 99.8 fL    ASSESSMENT & PLAN  Brandy Leonard was seen today for nasal congestion, chest pain and immunizations.  Diagnoses and all orders for this visit:  Precordial pain -     EKG 12-Lead -     POCT CBC -     DG Chest 2 View; Future  Diaphoresis -     EKG 12-Lead -     POCT CBC -     DG Chest 2 View; Future  Tachycardia -     POCT CBC -     DG Chest 2 View; Future  Cough -     POCT CBC -     DG Chest 2 View; Future  Bronchospasm

## 2015-05-19 ENCOUNTER — Other Ambulatory Visit: Payer: Self-pay | Admitting: Family Medicine

## 2015-05-19 ENCOUNTER — Other Ambulatory Visit: Payer: Self-pay | Admitting: Physician Assistant

## 2015-05-19 DIAGNOSIS — G4700 Insomnia, unspecified: Secondary | ICD-10-CM

## 2015-05-19 MED ORDER — LORAZEPAM 0.5 MG PO TABS: 0.5000 mg | ORAL_TABLET | Freq: Every day | ORAL | Status: DC

## 2015-05-19 NOTE — Progress Notes (Signed)
Pt presented to office with an old Rx for ativan written in 10/2014.  Upon review of her chart she had a Rx for #100 written in 01/17/2015 that is at [facility] - I gave the patient a #10d supply and she is to contact [facility] for that refill.Marland Kitchen

## 2015-05-24 ENCOUNTER — Ambulatory Visit (INDEPENDENT_AMBULATORY_CARE_PROVIDER_SITE_OTHER): Payer: Medicare Other | Admitting: Family Medicine

## 2015-05-24 VITALS — BP 124/88 | HR 85 | Temp 97.6°F | Resp 18 | Ht 64.0 in | Wt 153.6 lb

## 2015-05-24 DIAGNOSIS — J069 Acute upper respiratory infection, unspecified: Secondary | ICD-10-CM

## 2015-05-24 DIAGNOSIS — Z7185 Encounter for immunization safety counseling: Secondary | ICD-10-CM

## 2015-05-24 DIAGNOSIS — Z7189 Other specified counseling: Secondary | ICD-10-CM

## 2015-05-24 DIAGNOSIS — B029 Zoster without complications: Secondary | ICD-10-CM | POA: Diagnosis not present

## 2015-05-24 DIAGNOSIS — Z23 Encounter for immunization: Secondary | ICD-10-CM | POA: Diagnosis not present

## 2015-05-24 DIAGNOSIS — L853 Xerosis cutis: Secondary | ICD-10-CM | POA: Diagnosis not present

## 2015-05-24 MED ORDER — TRIAMCINOLONE ACETONIDE 0.1 % EX CREA: 1.0000 "application " | TOPICAL_CREAM | Freq: Two times a day (BID) | CUTANEOUS | Status: DC

## 2015-05-24 MED ORDER — VALACYCLOVIR HCL 1 G PO TABS: 1000.0000 mg | ORAL_TABLET | Freq: Three times a day (TID) | ORAL | Status: DC

## 2015-05-24 MED ORDER — AMOXICILLIN 500 MG PO CAPS: 500.0000 mg | ORAL_CAPSULE | Freq: Three times a day (TID) | ORAL | Status: DC

## 2015-05-24 NOTE — Progress Notes (Signed)
Patient Name: Brandy Leonard Date of Birth: 1945/12/25 Medical Record Number: [id number] Gender: female Date of Encounter: 05/24/2015  Chief Complaint: Rash; Follow-up; and Flu Vaccine   History of Present Illness:  Brandy Leonard is a 69 y.o. very pleasant female patient who presents with the following:   1. Left hip rash of several days duration which is become itchy. The red raised rash is along the dermatomal line. She's had the shingles vaccine   2: Dry itchy skin on both legs for over a week. No treatment started. No history of contact issues   3. Needs flu shot    4. Asian has upper respiratory congestion for the last week. As is been associated with cough.    patient is a Forensic psychologist  Patient Active Problem List   Diagnosis Date Noted  . Insomnia 02/26/2012    Priority: Low  . First degree hemorrhoids 02/08/2015  . Lichen sclerosus of female genitalia 02/08/2015  . Allergic rhinitis due to pollen 02/08/2015   Past Medical History  Diagnosis Date  . Insomnia   . Allergy     Benadryl, Flonase  . Lichen sclerosus of female genitalia   . Hemorrhoids    History reviewed. No pertinent past surgical history. Social History  Substance Use Topics  . Smoking status: Former Research scientist (life sciences)  . Smokeless tobacco: None  . Alcohol Use: 1.2 oz/week    2 Glasses of wine per week   Family History  Problem Relation Age of Onset  . Hypertension Mother   . Heart disease Mother   . Thyroid disease Father   . Cancer Father 64    Bladder cancer  . Hypertension Brother   . Stroke Maternal Grandmother   . Cancer Maternal Grandfather    Allergies  Allergen Reactions  . Prednisone Swelling    Of face    Medication list has been reviewed and updated.  Current Outpatient Prescriptions on File Prior to Visit  Medication Sig Dispense Refill  . LORazepam (ATIVAN) 0.5 MG tablet Take 1-2 tablets (0.5-1 mg total) by mouth at bedtime. 10 tablet 0  . albuterol (PROVENTIL  HFA;VENTOLIN HFA) 108 (90 BASE) MCG/ACT inhaler Inhale 2 puffs into the lungs every 6 (six) hours as needed for wheezing or shortness of breath. (Patient not taking: Reported on 05/24/2015) 1 Inhaler 1  . clobetasol cream (TEMOVATE) AB-123456789 % Apply 1 application topically 2 (two) times daily. (Patient not taking: Reported on 05/24/2015) 30 g 11  . fluticasone (FLONASE) 50 MCG/ACT nasal spray USE 1 TO 2 SPRAYS IN EACH NOSTRIL EVERY DAY (OFFICE VISIT NEEDED FOR ADDITIONAL REFILLS) (Patient not taking: Reported on 05/24/2015) 48 g 2  . HYDROcodone-acetaminophen (HYCET) 7.5-325 mg/15 ml solution Take 5 mLs by mouth every 4 (four) hours as needed. (Patient not taking: Reported on 05/24/2015) 120 mL 0  . hydrocortisone (ANUSOL-HC) 2.5 % rectal cream Place 1 application rectally 2 (two) times daily. (Patient not taking: Reported on 05/24/2015) 30 g 0  . hydrocortisone (ANUSOL-HC) 25 MG suppository Place 1 suppository (25 mg total) rectally 2 (two) times daily. (Patient not taking: Reported on 05/24/2015) 12 suppository 1   No current facility-administered medications on file prior to visit.    Review of Systems:   negative for chest pain, fever, shortness of breath  Physical Examination: Filed Vitals:   05/24/15 0935  BP: 124/88  Pulse: 85  Temp: 97.6 F (36.4 C)  Resp: 18   Filed Vitals:   05/24/15 0935  Height: 5\' 4"  (1.626 m)  Weight: 153 lb 9.6 oz (69.673 kg)   Body mass index is 26.35 kg/(m^2). Ideal Body Weight: Weight in (lb) to have BMI = 25: 145.3   HEENT: moderate mucopurulent is nasal drainage, otherwise negative Chest: Clear Skin: salmon-colored linear rash in the left hip and anterior thigh corresponding to dermatomal rash. Dry skin on the extremities  EKG / Labs / Xrays: None available at time of encounter  Assessment and Plan: This chart was scribed in my presence and reviewed by me personally.    ICD-9-CM ICD-10-CM   1. Shingles 053.9 B02.9 valACYclovir (VALTREX) 1000  MG tablet  2. Xerosis cutis 706.8 L85.3 triamcinolone cream (KENALOG) 0.1 %  3. Acute upper respiratory infection 465.9 J06.9 amoxicillin (AMOXIL) 500 MG capsule  4. Immunization counseling V65.49 Z71.89      Signed, Robyn Haber, MD   Robyn Haber, MD

## 2015-05-24 NOTE — Addendum Note (Signed)
Addended by: Gustavus Bryant on: 05/24/2015 10:38 AM   Modules accepted: Orders

## 2015-05-24 NOTE — Patient Instructions (Signed)
If the cold symptoms don't resolve in the next couple days, stat the amoxicillin.

## 2015-05-26 ENCOUNTER — Other Ambulatory Visit: Payer: Self-pay

## 2015-05-26 DIAGNOSIS — G4700 Insomnia, unspecified: Secondary | ICD-10-CM

## 2015-05-26 NOTE — Telephone Encounter (Signed)
Please refill lorazepam for 6 months

## 2015-05-26 NOTE — Telephone Encounter (Signed)
Dr L, do you want to give more # on RFs?

## 2015-05-31 MED ORDER — LORAZEPAM 0.5 MG PO TABS: 0.5000 mg | ORAL_TABLET | Freq: Every day | ORAL | Status: DC

## 2015-05-31 NOTE — Telephone Encounter (Signed)
OK to refill Lorazepam for six months as approved.

## 2015-06-01 NOTE — Telephone Encounter (Signed)
Called in Rx

## 2015-06-09 ENCOUNTER — Other Ambulatory Visit: Payer: Self-pay

## 2015-07-09 ENCOUNTER — Other Ambulatory Visit: Payer: Self-pay | Admitting: Family Medicine

## 2015-08-29 ENCOUNTER — Other Ambulatory Visit: Payer: Self-pay | Admitting: Family Medicine

## 2015-08-29 DIAGNOSIS — G4700 Insomnia, unspecified: Secondary | ICD-10-CM

## 2015-08-29 NOTE — Telephone Encounter (Signed)
Needs the 90 day supply to go to [place]. Can someone sign and I can fax. Dr Tamala Julian wrote for 6 months back in December.

## 2015-08-29 NOTE — Telephone Encounter (Signed)
Pt is req. Refill for   Original Order:  LORazepam (ATIVAN) 0.5 MG tablet [id number] req. For Qty: 100 to be sent to local and mail order pharm.       [facility]  Pharmacy:  [facility] [address] [place] - [place] [address]          [phone] Please call when complete

## 2015-08-29 NOTE — Telephone Encounter (Signed)
I'm confused -- pt got 6 month ativan rx 05/2015 but is requesting a refill after 3 months? If she is needing a refill this early, she will have to come in and discuss with Dr. Tamala Julian.

## 2015-09-04 ENCOUNTER — Ambulatory Visit (INDEPENDENT_AMBULATORY_CARE_PROVIDER_SITE_OTHER): Payer: Medicare Other | Admitting: Family Medicine

## 2015-09-04 VITALS — BP 116/68 | HR 93 | Temp 99.2°F | Resp 18 | Ht 64.0 in | Wt 170.0 lb

## 2015-09-04 DIAGNOSIS — S93402A Sprain of unspecified ligament of left ankle, initial encounter: Secondary | ICD-10-CM

## 2015-09-04 DIAGNOSIS — G4700 Insomnia, unspecified: Secondary | ICD-10-CM

## 2015-09-04 MED ORDER — LORAZEPAM 0.5 MG PO TABS: 0.5000 mg | ORAL_TABLET | Freq: Every day | ORAL | Status: DC

## 2015-09-04 NOTE — Progress Notes (Signed)
By signing my name below, I, Moises Blood, attest that this documentation has been prepared under the direction and in the presence of Robyn Haber, MD. Electronically Signed: Moises Blood, [place]. 09/04/2015 , 11:05 AM .  Patient was seen in room 14 .   Patient ID: Brandy Leonard MRN: [id number], DOB: [birth year], 70 y.o. Date of Encounter: 09/04/2015  Primary Physician: Reginia Forts, MD  Chief Complaint:  Chief Complaint  Patient presents with   Ankle Pain    left, radiates up leg, x 3-4 days    HPI:  Brandy Leonard is a 70 y.o. female who presents to [facility] complaining of left ankle pain that's been radiating up her leg over the past 3-4 days. She was walking and stepped into a hole. She had a history of ACL surgery. She currently has some pain with movement of the ankle. She's applied ice and voltaren cream over the ankle. She's also been wearing a sock-brace for it.   Past Medical History  Diagnosis Date   Insomnia    Allergy     Benadryl, Flonase   Lichen sclerosus of female genitalia    Hemorrhoids      Home Meds: Prior to Admission medications   Medication Sig Start Date End Date Taking? Authorizing Provider  albuterol (PROVENTIL HFA;VENTOLIN HFA) 108 (90 BASE) MCG/ACT inhaler Inhale 2 puffs into the lungs every 6 (six) hours as needed for wheezing or shortness of breath. Patient not taking: Reported on 05/24/2015 04/04/15   Orma Flaming, MD  amoxicillin (AMOXIL) 500 MG capsule Take 1 capsule (500 mg total) by mouth 3 (three) times daily. 05/24/15   Robyn Haber, MD  clobetasol cream (TEMOVATE) [id number] % Apply 1 application topically 2 (two) times daily. Patient not taking: Reported on 05/24/2015 10/29/14   Robyn Haber, MD  fluticasone [facility]) 50 MCG/ACT nasal spray USE 1 TO 2 SPRAYS IN EACH NOSTRIL EVERY DAY (OFFICE VISIT NEEDED FOR ADDITIONAL REFILLS) Patient not taking: Reported on 05/24/2015 12/14/14   Robyn Haber, MD    HYDROcodone-acetaminophen (HYCET) 7.5-325 mg/15 ml solution Take 5 mLs by mouth every 4 (four) hours as needed. Patient not taking: Reported on 05/24/2015 04/04/15   Orma Flaming, MD  hydrocortisone (ANUSOL-HC) 2.5 % rectal cream Place 1 application rectally 2 (two) times daily. Patient not taking: Reported on 05/24/2015 10/29/14   Robyn Haber, MD  hydrocortisone (ANUSOL-HC) 25 MG suppository Place 1 suppository (25 mg total) rectally 2 (two) times daily. Patient not taking: Reported on 05/24/2015 01/17/15   Wardell Honour, MD  LORazepam (ATIVAN) 0.5 MG tablet Take 1-2 tablets (0.5-1 mg total) by mouth at bedtime. 05/31/15   Wardell Honour, MD  triamcinolone cream (KENALOG) 0.1 % Apply 1 application topically 2 (two) times daily. 05/24/15   Robyn Haber, MD  valACYclovir (VALTREX) 1000 MG tablet Take 1 tablet (1,000 mg total) by mouth 3 (three) times daily. 05/24/15   Robyn Haber, MD    Allergies:  Allergies  Allergen Reactions   Prednisone Swelling    Of face    Social History   Social History   Marital Status: Divorced    Spouse Name: N/A   Number of Children: N/A   Years of Education: N/A   Occupational History   Not on file.   Social History Main Topics   Smoking status: Former Smoker   Smokeless tobacco: Not on file   Alcohol Use: 1.2 oz/week    2 Glasses of wine per week  Drug Use: No   Sexual Activity: No   Other Topics Concern   Not on file   Social History Narrative   Marital status: divorced; not dating; not interested.      Children:  None      Lives: alone      Employment:  Realtor full time.      Tobacco:  Quit smoking 1999.  Social smoker.       Alcohol:  Wine two nights per week.       Exercise:  Sporadic exercise.  Treadmill      Advanced Directive: +living will; HCPOA: brother Duffy Rhody). FULL CODE no prolonged measures.        Seatbelt:  100%      ADLs: independent with ADLs.         Review of Systems:  Constitutional:  negative for fever, chills, night sweats, weight changes, or fatigue  HEENT: negative for vision changes, hearing loss, congestion, rhinorrhea, ST, epistaxis, or sinus pressure Cardiovascular: negative for chest pain or palpitations Respiratory: negative for hemoptysis, wheezing, shortness of breath, or cough Abdominal: negative for abdominal pain, nausea, vomiting, diarrhea, or constipation Dermatological: negative for rash Musc: positive for arthralgia (left ankle) Neurologic: negative for headache, dizziness, or syncope All other systems reviewed and are otherwise negative with the exception to those above and in the HPI.  Physical Exam:  Blood pressure 116/68, pulse 93, temperature 99.2 F (37.3 C), resp. rate 18, height 5\' 4"  (1.626 m), weight 170 lb (77.111 kg), SpO2 98 %., Body mass index is 29.17 kg/(m^2). General: Well developed, well nourished, in no acute distress. Head: Normocephalic, atraumatic, eyes without discharge, sclera non-icteric, nares are without discharge. Bilateral auditory canals clear, TM's are without perforation, pearly grey and translucent with reflective cone of light bilaterally. Oral cavity moist, posterior pharynx without exudate, erythema, peritonsillar abscess, or post nasal drip.  Neck: Supple. No thyromegaly. Full ROM. No lymphadenopathy. Lungs: Clear bilaterally to auscultation without wheezes, rales, or rhonchi. Breathing is unlabored. Heart: RRR with S1 S2. No murmurs, rubs, or gallops appreciated. Msk:  Strength and tone normal for age. Extremities/Skin: Warm and dry. tenderness surrounding lateral malleus  Neuro: Alert and oriented X 3. Moves all extremities spontaneously. Gait is normal. CNII-XII grossly in tact. Psych:  Responds to questions appropriately with a normal affect.   Labs:  ASSESSMENT AND PLAN:  70 y.o. year old female with Insomnia - Plan: LORazepam (ATIVAN) 0.5 MG tablet  Left ankle sprain, initial encounter   This chart was  scribed in my presence and reviewed by me personally.     Signed, Robyn Haber, MD 09/04/2015 11:25 AM

## 2015-09-04 NOTE — Patient Instructions (Addendum)
Your ankle has all the features of sprain and should resolve with 1 week of limping around. Obviously go on a spring it again but I don't think you need any appliance at this point. There is no sign that there is any fracture or serious bony injury.

## 2015-09-15 NOTE — Telephone Encounter (Signed)
She has 3 refills left on original Rx but she now has to use [facility] pharmacy so we have to send it there. If Rx is written I can call [facility] to cancel the refills. She is just switching pharmacies.

## 2015-10-20 ENCOUNTER — Other Ambulatory Visit: Payer: Self-pay

## 2015-10-20 DIAGNOSIS — Z1231 Encounter for screening mammogram for malignant neoplasm of breast: Secondary | ICD-10-CM

## 2015-11-09 ENCOUNTER — Ambulatory Visit
Admission: RE | Admit: 2015-11-09 | Discharge: 2015-11-09 | Disposition: A | Discharge status: Home / Self Care | Payer: Medicare Other | Source: Ambulatory Visit

## 2015-11-09 DIAGNOSIS — Z1231 Encounter for screening mammogram for malignant neoplasm of breast: Secondary | ICD-10-CM

## 2015-11-15 DIAGNOSIS — H353130 Nonexudative age-related macular degeneration, bilateral, stage unspecified: Secondary | ICD-10-CM | POA: Diagnosis not present

## 2015-11-15 DIAGNOSIS — H04129 Dry eye syndrome of unspecified lacrimal gland: Secondary | ICD-10-CM | POA: Diagnosis not present

## 2016-02-24 ENCOUNTER — Other Ambulatory Visit: Payer: Self-pay | Admitting: Family Medicine

## 2016-02-24 DIAGNOSIS — G4700 Insomnia, unspecified: Secondary | ICD-10-CM

## 2016-03-03 ENCOUNTER — Other Ambulatory Visit: Payer: Self-pay | Admitting: Physician Assistant

## 2016-03-03 DIAGNOSIS — G4700 Insomnia, unspecified: Secondary | ICD-10-CM

## 2016-03-03 MED ORDER — LORAZEPAM 0.5 MG PO TABS: 0.5000 mg | ORAL_TABLET | Freq: Every day | ORAL | 0 refills | Status: DC

## 2016-03-03 NOTE — Progress Notes (Signed)
Done - I wrote a month supply and she will need an OV with her new PCP prior to more refills - Rx given to patient

## 2016-03-06 ENCOUNTER — Other Ambulatory Visit: Payer: Self-pay

## 2016-03-06 DIAGNOSIS — G4700 Insomnia, unspecified: Secondary | ICD-10-CM

## 2016-03-06 NOTE — Telephone Encounter (Signed)
Pended for review

## 2016-03-06 NOTE — Telephone Encounter (Signed)
Patient needs her LORazepam (ATIVAN) 0.5 MG tablet refilled, stated she would be out before her appointment on 04/04/16. I let her know that we could give her enough to last her until her appointment with Dr. Tamala Julian.  She uses the [facility] on [address].

## 2016-03-07 MED ORDER — LORAZEPAM 0.5 MG PO TABS: 0.5000 mg | ORAL_TABLET | Freq: Every day | ORAL | 0 refills | Status: DC

## 2016-03-08 NOTE — Telephone Encounter (Signed)
Faxed and notified pt on VM. 

## 2016-03-20 ENCOUNTER — Encounter: Payer: Medicare Other | Admitting: Family Medicine

## 2016-04-04 ENCOUNTER — Encounter: Payer: Self-pay | Admitting: Family Medicine

## 2016-04-04 ENCOUNTER — Ambulatory Visit (INDEPENDENT_AMBULATORY_CARE_PROVIDER_SITE_OTHER): Payer: Medicare Other | Admitting: Family Medicine

## 2016-04-04 VITALS — BP 124/82 | HR 88 | Temp 98.4°F | Resp 16 | Ht 64.5 in | Wt 171.2 lb

## 2016-04-04 DIAGNOSIS — F5101 Primary insomnia: Secondary | ICD-10-CM | POA: Diagnosis not present

## 2016-04-04 DIAGNOSIS — Z1159 Encounter for screening for other viral diseases: Secondary | ICD-10-CM

## 2016-04-04 DIAGNOSIS — J301 Allergic rhinitis due to pollen: Secondary | ICD-10-CM | POA: Diagnosis not present

## 2016-04-04 DIAGNOSIS — E7800 Pure hypercholesterolemia, unspecified: Secondary | ICD-10-CM | POA: Diagnosis not present

## 2016-04-04 DIAGNOSIS — Z131 Encounter for screening for diabetes mellitus: Secondary | ICD-10-CM

## 2016-04-04 DIAGNOSIS — Z0000 Encounter for general adult medical examination without abnormal findings: Secondary | ICD-10-CM | POA: Diagnosis not present

## 2016-04-04 DIAGNOSIS — N904 Leukoplakia of vulva: Secondary | ICD-10-CM | POA: Diagnosis not present

## 2016-04-04 DIAGNOSIS — K640 First degree hemorrhoids: Secondary | ICD-10-CM

## 2016-04-04 DIAGNOSIS — Z23 Encounter for immunization: Secondary | ICD-10-CM | POA: Diagnosis not present

## 2016-04-04 LAB — CBC WITH DIFFERENTIAL/PLATELET
Basophils Absolute: 0 cells/uL (ref 0–200)
Basophils Relative: 0 %
Eosinophils Absolute: 231 cells/uL (ref 15–500)
Eosinophils Relative: 3 %
HCT: 41 % (ref 35.0–45.0)
Hemoglobin: 13.7 g/dL (ref 11.7–15.5)
Lymphocytes Relative: 22 %
Lymphs Abs: 1694 cells/uL (ref 850–3900)
MCH: 28 pg (ref 27.0–33.0)
MCHC: 33.4 g/dL (ref 32.0–36.0)
MCV: 83.7 fL (ref 80.0–100.0)
MPV: 9.9 fL (ref 7.5–12.5)
Monocytes Absolute: 539 cells/uL (ref 200–950)
Monocytes Relative: 7 %
Neutro Abs: 5236 cells/uL (ref 1500–7800)
Neutrophils Relative %: 68 %
Platelets: 299 10*3/uL (ref 140–400)
RBC: 4.9 MIL/uL (ref 3.80–5.10)
RDW: 13.9 % (ref 11.0–15.0)
WBC: 7.7 10*3/uL (ref 3.8–10.8)

## 2016-04-04 LAB — COMPREHENSIVE METABOLIC PANEL
ALT: 26 U/L (ref 6–29)
AST: 20 U/L (ref 10–35)
Albumin: 4.1 g/dL (ref 3.6–5.1)
Alkaline Phosphatase: 88 U/L (ref 33–130)
BUN: 11 mg/dL (ref 7–25)
CO2: 24 mmol/L (ref 20–31)
Calcium: 9.3 mg/dL (ref 8.6–10.4)
Chloride: 104 mmol/L (ref 98–110)
Creat: 0.65 mg/dL (ref 0.50–0.99)
Glucose, Bld: 97 mg/dL (ref 65–99)
Potassium: 4.4 mmol/L (ref 3.5–5.3)
Sodium: 138 mmol/L (ref 135–146)
Total Bilirubin: 0.5 mg/dL (ref 0.2–1.2)
Total Protein: 6.8 g/dL (ref 6.1–8.1)

## 2016-04-04 LAB — POCT URINALYSIS DIP (MANUAL ENTRY)
Bilirubin, UA: NEGATIVE
Blood, UA: NEGATIVE
Glucose, UA: NEGATIVE
Ketones, POC UA: NEGATIVE
Leukocytes, UA: NEGATIVE
Nitrite, UA: NEGATIVE
Protein Ur, POC: NEGATIVE
Spec Grav, UA: <=1.005
Urobilinogen, UA: 0.2
pH, UA: 7

## 2016-04-04 LAB — LIPID PANEL
Cholesterol: 209 mg/dL — ABNORMAL HIGH (ref 125–200)
HDL: 62 mg/dL (ref >=46)
LDL Cholesterol: 127 mg/dL (ref <130)
Total CHOL/HDL Ratio: 3.4 Ratio (ref <=5.0)
Triglycerides: 102 mg/dL (ref <150)
VLDL: 20 mg/dL (ref <30)

## 2016-04-04 MED ORDER — HYDROCORTISONE ACETATE 25 MG RE SUPP: 25.0000 mg | Freq: Two times a day (BID) | RECTAL | 1 refills | Status: DC

## 2016-04-04 MED ORDER — TRIAMCINOLONE ACETONIDE 0.1 % EX CREA: 1.0000 "application " | TOPICAL_CREAM | Freq: Two times a day (BID) | CUTANEOUS | 0 refills | Status: DC

## 2016-04-04 MED ORDER — FLUTICASONE PROPIONATE 50 MCG/ACT NA SUSP: NASAL | 11 refills | Status: DC

## 2016-04-04 MED ORDER — LORAZEPAM 0.5 MG PO TABS: 0.5000 mg | ORAL_TABLET | Freq: Every day | ORAL | 1 refills | Status: DC

## 2016-04-04 MED ORDER — HYDROCORTISONE 2.5 % RE CREA: 1.0000 "application " | TOPICAL_CREAM | Freq: Two times a day (BID) | RECTAL | 2 refills | Status: DC

## 2016-04-04 MED ORDER — CLOBETASOL PROPIONATE 0.05 % EX CREA: 1.0000 "application " | TOPICAL_CREAM | Freq: Two times a day (BID) | CUTANEOUS | 11 refills | Status: AC

## 2016-04-04 NOTE — Patient Instructions (Addendum)
   IF you received an x-ray today, you will receive an invoice from [facility]. Please contact [facility] at [phone] with questions or concerns regarding your invoice.   IF you received labwork today, you will receive an invoice from [facility]. Please contact [facility] at [phone] with questions or concerns regarding your invoice.   Our billing staff will not be able to assist you with questions regarding bills from these companies.  You will be contacted with the lab results as soon as they are available. The fastest way to get your results is to activate your My Chart account. Instructions are located on the last page of this paperwork. If you have not heard from us regarding the results in 2 weeks, please contact this office.    Influenza (Flu) Vaccine (Inactivated or Recombinant):  1. Why get vaccinated? Influenza ("flu") is a contagious disease that spreads around the [place] every year, usually between October and May. Flu is caused by influenza viruses, and is spread mainly by coughing, sneezing, and close contact. Anyone can get flu. Flu strikes suddenly and can last several days. Symptoms vary by age, but can include:  fever/chills  sore throat  muscle aches  fatigue  cough  headache  runny or stuffy nose Flu can also lead to pneumonia and blood infections, and cause diarrhea and seizures in children. If you have a medical condition, such as heart or lung disease, flu can make it worse. Flu is more dangerous for some people. Infants and young children, people 65 years of age and older, pregnant women, and people with certain health conditions or a weakened immune system are at greatest risk. Each year thousands of people in the [place] die from flu, and many more are hospitalized. Flu vaccine can:  keep you from getting flu,  make flu less severe if you do get it, and  keep you from spreading flu to  your family and other people. 2. Inactivated and recombinant flu vaccines A dose of flu vaccine is recommended every flu season. Children 6 months through 8 years of age may need two doses during the same flu season. Everyone else needs only one dose each flu season. Some inactivated flu vaccines contain a very small amount of a mercury-based preservative called thimerosal. Studies have not shown thimerosal in vaccines to be harmful, but flu vaccines that do not contain thimerosal are available. There is no live flu virus in flu shots. They cannot cause the flu. There are many flu viruses, and they are always changing. Each year a new flu vaccine is made to protect against three or four viruses that are likely to cause disease in the upcoming flu season. But even when the vaccine doesn't exactly match these viruses, it may still provide some protection. Flu vaccine cannot prevent:  flu that is caused by a virus not covered by the vaccine, or  illnesses that look like flu but are not. It takes about 2 weeks for protection to develop after vaccination, and protection lasts through the flu season. 3. Some people should not get this vaccine Tell the person who is giving you the vaccine:  If you have any severe, life-threatening allergies. If you ever had a life-threatening allergic reaction after a dose of flu vaccine, or have a severe allergy to any part of this vaccine, you may be advised not to get vaccinated. Most, but not all, types of flu vaccine contain a small amount of egg protein.    If you ever had Guillain-Barre Syndrome (also called GBS). Some people with a history of GBS should not get this vaccine. This should be discussed with your doctor.  If you are not feeling well. It is usually okay to get flu vaccine when you have a mild illness, but you might be asked to come back when you feel better. 4. Risks of a vaccine reaction With any medicine, including vaccines, there is a chance of  reactions. These are usually mild and go away on their own, but serious reactions are also possible. Most people who get a flu shot do not have any problems with it. Minor problems following a flu shot include:  soreness, redness, or swelling where the shot was given  hoarseness  sore, red or itchy eyes  cough  fever  aches  headache  itching  fatigue If these problems occur, they usually begin soon after the shot and last 1 or 2 days. More serious problems following a flu shot can include the following:  There may be a small increased risk of Guillain-Barre Syndrome (GBS) after inactivated flu vaccine. This risk has been estimated at 1 or 2 additional cases per million people vaccinated. This is much lower than the risk of severe complications from flu, which can be prevented by flu vaccine.  Young children who get the flu shot along with pneumococcal vaccine (PCV13) and/or DTaP vaccine at the same time might be slightly more likely to have a seizure caused by fever. Ask your doctor for more information. Tell your doctor if a child who is getting flu vaccine has ever had a seizure. Problems that could happen after any injected vaccine:  People sometimes faint after a medical procedure, including vaccination. Sitting or lying down for about 15 minutes can help prevent fainting, and injuries caused by a fall. Tell your doctor if you feel dizzy, or have vision changes or ringing in the ears.  Some people get severe pain in the shoulder and have difficulty moving the arm where a shot was given. This happens very rarely.  Any medication can cause a severe allergic reaction. Such reactions from a vaccine are very rare, estimated at about 1 in a million doses, and would happen within a few minutes to a few hours after the vaccination. As with any medicine, there is a very remote chance of a vaccine causing a serious injury or death. The safety of vaccines is always being monitored. For  more information, visit: www.cdc.gov/vaccinesafety/ 5. What if there is a serious reaction? What should I look for?  Look for anything that concerns you, such as signs of a severe allergic reaction, very high fever, or unusual behavior. Signs of a severe allergic reaction can include hives, swelling of the face and throat, difficulty breathing, a fast heartbeat, dizziness, and weakness. These would start a few minutes to a few hours after the vaccination. What should I do?  If you think it is a severe allergic reaction or other emergency that can't wait, call 9-1-1 and get the person to the nearest hospital. Otherwise, call your doctor.  Reactions should be reported to the Vaccine Adverse Event Reporting System (VAERS). Your doctor should file this report, or you can do it yourself through the VAERS web site at www.vaers.hhs.gov, or by calling 1-800-822-7967. VAERS does not give medical advice. 6. The National Vaccine Injury Compensation Program The National Vaccine Injury Compensation Program (VICP) is a federal program that was created to compensate people who may have been   injured by certain vaccines. Persons who believe they may have been injured by a vaccine can learn about the program and about filing a claim by calling 870-815-8443 or visiting the [place] website at GoldCloset.com.ee. There is a time limit to file a claim for compensation. 7. How can I learn more?  Ask your healthcare provider. He or she can give you the vaccine package insert or suggest other sources of information.  Call your local or state health department.  Contact the Centers for Disease Control and Prevention (CDC):  Call 470-424-8081 (1-800-CDC-INFO) or  Visit CDC's website at https://gibson.com/ Vaccine Information Statement Inactivated Influenza Vaccine (01/15/2014)   This information is not intended to replace advice given to you by your health care provider. Make sure you discuss any  questions you have with your health care provider.   Document Released: 03/22/2006 Document Revised: 06/18/2014 Document Reviewed: 01/18/2014 Elsevier Interactive Patient Education 2016 [place] [employer]  Get These Tests  Blood Pressure- Have your blood pressure checked by your healthcare provider at least once a year.  Normal blood pressure is 120/80.  Weight- Have your body mass index (BMI) calculated to screen for obesity.  BMI is a measure of body fat based on height and weight.  You can calculate your own BMI at GravelBags.it  Cholesterol- Have your cholesterol checked every year.  Diabetes- Have your blood sugar checked every year if you have high blood pressure, high cholesterol, a family history of diabetes or if you are overweight.  Pap Test - Have a pap test every 1 to 5 years if you have been sexually active.  If you are older than 65 and recent pap tests have been normal you may not need additional pap tests.  In addition, if you have had a hysterectomy  for benign disease additional pap tests are not necessary.  Mammogram-Yearly mammograms are essential for early detection of breast cancer  Screening for Colon Cancer- Colonoscopy starting at age 20. Screening may begin sooner depending on your family history and other health conditions.  Follow up colonoscopy as directed by your Gastroenterologist.  Screening for Osteoporosis- Screening begins at age 25 with bone density scanning, sooner if you are at higher risk for developing Osteoporosis.  Get these medicines  Calcium with Vitamin D- Your body requires 1200-1500 mg of Calcium a day and [phone] IU of Vitamin D a day.  You can only absorb 500 mg of Calcium at a time therefore Calcium must be taken in 2 or 3 separate doses throughout the day.  Hormones- Hormone therapy has been associated with increased risk for certain cancers and heart disease.  Talk to your healthcare provider about if you  need relief from menopausal symptoms.  Aspirin- Ask your healthcare provider about taking Aspirin to prevent Heart Disease and Stroke.  Get these Immuniztions  Flu shot- Every fall  Pneumonia shot- Once after the age of 71; if you are younger ask your healthcare provider if you need a pneumonia shot.  Tetanus- Every ten years.  Zostavax- Once after the age of 71 to prevent shingles.  Take these steps  Don't smoke- Your healthcare provider can help you quit. For tips on how to quit, ask your healthcare provider or go to www.smokefree.gov or call 1-800 QUIT-NOW.  Be physically active- Exercise 5 days a week for a minimum of 30 minutes.  If you are not already physically active, start slow and gradually work up to 30 minutes of moderate physical activity.  Try  walking, dancing, bike riding, swimming, etc.  Eat a healthy diet- Eat a variety of healthy foods such as fruits, vegetables, whole grains, low fat milk, low fat cheeses, yogurt, lean meats, chicken, fish, eggs, dried beans, tofu, etc.  For more information go to www.thenutritionsource.org  Dental visit- Brush and floss teeth twice daily; visit your dentist twice a year.  Eye exam- Visit your Optometrist or Ophthalmologist yearly.  Drink alcohol in moderation- Limit alcohol intake to one drink or less a day.  Never drink and drive.  Depression- Your emotional health is as important as your physical health.  If you're feeling down or losing interest in things you normally enjoy, please talk to your healthcare provider.  Seat Belts- can save your life; always wear one  Smoke/Carbon Monoxide detectors- These detectors need to be installed on the appropriate level of your home.  Replace batteries at least once a year.  Violence- If anyone is threatening or hurting you, please tell your healthcare provider.  Living Will/ Health care power of attorney- Discuss with your healthcare provider and family.

## 2016-04-04 NOTE — Progress Notes (Signed)
Subjective:    Patient ID: Brandy Leonard, female    DOB: 1946-04-22, 70 y.o.   MRN: [id number]  HPI This 69 y.o. female presents for Complete Physical Examination.  Last physical: 01-17-2015 Pap smear: Taavon; every other year. Mammogram:  11/14/2015 Colonoscopy:  05/20/2013; every 5 years Bone density:  06/10/2014 TDAP:  n/d Pneumovax:  2014, 2016 Zostavax:  2014; shingles infection 05/2015.  Influenza:  today Eye exam: every six months; macular degeneration. Dental exam: twice yearly.  Bad fall this year: ate at [facility]; open toe shoes; rug had come apart; foot got stuck; flipped and landed on knees B on ceramic floor.  Iced knees BO.  Horrible bronchitis infection: followed by shingles.  Lichen sclerosis:  Needs refill of Clobetasol.  Wants substitute.   Hemorrhoids: insurance will not cover Anusol HC.   Insomnia: taking Lorazepam every day.    Review of Systems  Constitutional: Negative.  Negative for activity change, appetite change, chills, diaphoresis, fatigue, fever and unexpected weight change.  HENT: Negative.  Negative for congestion, dental problem, drooling, ear discharge, ear pain, facial swelling, hearing loss, mouth sores, nosebleeds, postnasal drip, rhinorrhea, sinus pressure, sneezing, sore throat, tinnitus, trouble swallowing and voice change.   Eyes: Negative.  Negative for photophobia, pain, discharge, redness, itching and visual disturbance.  Respiratory: Negative.  Negative for apnea, cough, choking, chest tightness, shortness of breath, wheezing and stridor.   Cardiovascular: Negative.  Negative for chest pain, palpitations and leg swelling.  Gastrointestinal: Negative.  Negative for abdominal distention, abdominal pain, anal bleeding, blood in stool, constipation, diarrhea, nausea, rectal pain and vomiting.       Heartburn; alkeseltzer PRN.  Endocrine: Negative.  Negative for cold intolerance, heat intolerance, polydipsia, polyphagia and  polyuria.  Genitourinary: Negative.  Negative for decreased urine volume, difficulty urinating, dyspareunia, dysuria, enuresis, flank pain, frequency, genital sores, hematuria, menstrual problem, pelvic pain, urgency, vaginal bleeding, vaginal discharge and vaginal pain.       Nocturia x 2.  No URINARY LEAKAGE.  Musculoskeletal: Negative.  Negative for arthralgias, back pain, gait problem, joint swelling, myalgias, neck pain and neck stiffness.  Skin: Negative.  Negative for color change, pallor, rash and wound.  Allergic/Immunologic: Negative.  Negative for environmental allergies, food allergies and immunocompromised state.  Neurological: Negative.  Negative for dizziness, tremors, seizures, syncope, facial asymmetry, speech difficulty, weakness, light-headedness, numbness and headaches.  Hematological: Negative.  Negative for adenopathy. Does not bruise/bleed easily.  Psychiatric/Behavioral: Positive for sleep disturbance. Negative for agitation, behavioral problems, confusion, decreased concentration, dysphoric mood, hallucinations, self-injury and suicidal ideas. The patient is not nervous/anxious and is not hyperactive.        Bedtime 10:00-11:00; wakes up whenever.   Past Medical History:  Diagnosis Date  . Allergy    Benadryl, Flonase  . GERD (gastroesophageal reflux disease)   . Hemorrhoids   . Insomnia   . Lichen sclerosus of female genitalia    No past surgical history on file. Allergies  Allergen Reactions  . Prednisone Swelling    Of face   Social History   Social History  . Marital status: Divorced    Spouse name: N/A  . Number of children: N/A  . Years of education: N/A   Occupational History  . Not on file.   Social History Main Topics  . Smoking status: Former Research scientist (life sciences)  . Smokeless tobacco: Never Used  . Alcohol use 1.2 oz/week    2 Glasses of wine per week  . Drug use: No  .  Sexual activity: No   Other Topics Concern  . Not on file   Social History  Narrative   Marital status: divorced; not dating; not interested in 2017.      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        Tobacco:  Quit smoking 1999.  Social smoker.       Alcohol:  Wine one glass per day.       Exercise:  Sporadic exercise.  Treadmill      Advanced Directive: +living will; HCPOA: brother Duffy Rhody). FULL CODE no prolonged measures.        Seatbelt:  100%;       ADLs: independent with ADLs. Drives.   Allergies  Allergen Reactions  . Prednisone Swelling    Of face        Objective:   Physical Exam  Constitutional: She is oriented to person, place, and time. She appears well-developed and well-nourished. No distress.  HENT:  Head: Normocephalic and atraumatic.  Right Ear: External ear normal.  Left Ear: External ear normal.  Nose: Nose normal.  Mouth/Throat: Oropharynx is clear and moist.  Eyes: Conjunctivae and EOM are normal. Pupils are equal, round, and reactive to light.  Neck: Normal range of motion and full passive range of motion without pain. Neck supple. No JVD present. Carotid bruit is not present. No thyromegaly present.  Cardiovascular: Normal rate, regular rhythm and normal heart sounds.  Exam reveals no gallop and no friction rub.   No murmur heard. Pulmonary/Chest: Effort normal and breath sounds normal. She has no wheezes. She has no rales.  Abdominal: Soft. Bowel sounds are normal. She exhibits no distension and no mass. There is no tenderness. There is no rebound and no guarding.  Musculoskeletal:       Right shoulder: Normal.       Left shoulder: Normal.       Cervical back: Normal.  Lymphadenopathy:    She has no cervical adenopathy.  Neurological: She is alert and oriented to person, place, and time. She has normal reflexes. No cranial nerve deficit. She exhibits normal muscle tone. Coordination normal.  Skin: Skin is warm and dry. No rash noted. She is not diaphoretic. No  erythema. No pallor.  Psychiatric: She has a normal mood and affect. Her behavior is normal. Judgment and thought content normal.  Nursing note and vitals reviewed.  Fall Risk  04/04/2016 01/17/2015 11/23/2013  Falls in the past year? No No Yes  Number falls in past yr: - - 1  Injury with Fall? - - Yes   Depression screen [facility] 2/9 04/04/2016 09/04/2015 05/24/2015 04/04/2015 01/17/2015  Decreased Interest 0 0 0 0 0  Down, Depressed, Hopeless 0 0 0 0 0  PHQ - 2 Score 0 0 0 0 0   Functional Status Survey: Is the patient deaf or have difficulty hearing?: No Does the patient have difficulty seeing, even when wearing glasses/contacts?: No Does the patient have difficulty concentrating, remembering, or making decisions?: No Does the patient have difficulty walking or climbing stairs?: No Does the patient have difficulty dressing or bathing?: No Does the patient have difficulty doing errands alone such as visiting a doctor's office or shopping?: No       Assessment & Plan:   1. Medicare annual wellness visit, subsequent   2. First degree hemorrhoids   3. Acute seasonal allergic rhinitis  due to pollen   4. Lichen sclerosus of female genitalia   5. Primary insomnia   6. Pure hypercholesterolemia   7. Screening for diabetes mellitus   8. Needs flu shot   9. Need for hepatitis C screening test    -anticipatory guidance provided -- exercise, weight loss, ASA 81mg  daily, 1200mg  calcium daily. -obtain age appropriate screening labs. -obtain chronic disease management labs. -independent with ADLs; no evidence of hearing loss; no evidence of depression; low fall risk.  Has advanced directives and desires FULL CODE. -refills provided.   Orders Placed This Encounter  Procedures  . Flu Vaccine QUAD 36+ mos IM  . Hepatitis C antibody  . CBC with Differential/Platelet  . Comprehensive metabolic panel  . Lipid panel  . POCT urinalysis dipstick   Meds ordered this encounter  Medications  .  LORazepam (ATIVAN) 0.5 MG tablet    Sig: Take 1-2 tablets (0.5-1 mg total) by mouth at bedtime.    Dispense:  100 tablet    Refill:  1  . fluticasone (FLONASE) 50 MCG/ACT nasal spray    Sig: USE 1 TO 2 SPRAYS IN EACH NOSTRIL EVERY DAY (OFFICE VISIT NEEDED FOR ADDITIONAL REFILLS)    Dispense:  48 g    Refill:  11  . clobetasol cream (TEMOVATE) 0.05 %    Sig: Apply 1 application topically 2 (two) times daily.    Dispense:  30 g    Refill:  11  . hydrocortisone (ANUSOL-HC) 2.5 % rectal cream    Sig: Place 1 application rectally 2 (two) times daily.    Dispense:  30 g    Refill:  2  . hydrocortisone (ANUSOL-HC) 25 MG suppository    Sig: Place 1 suppository (25 mg total) rectally 2 (two) times daily.    Dispense:  12 suppository    Refill:  1  . triamcinolone cream (KENALOG) 0.1 %    Sig: Apply 1 application topically 2 (two) times daily.    Dispense:  30 g    Refill:  0   Norwood Levo, M.D. [facility] [address] [place], [place]  [address] [phone] phone [phone] fax

## 2016-04-05 LAB — HEPATITIS C ANTIBODY: HCV Ab: NEGATIVE

## 2016-05-17 DIAGNOSIS — M9902 Segmental and somatic dysfunction of thoracic region: Secondary | ICD-10-CM | POA: Diagnosis not present

## 2016-05-17 DIAGNOSIS — M9901 Segmental and somatic dysfunction of cervical region: Secondary | ICD-10-CM | POA: Diagnosis not present

## 2016-05-17 DIAGNOSIS — M531 Cervicobrachial syndrome: Secondary | ICD-10-CM | POA: Diagnosis not present

## 2016-05-17 DIAGNOSIS — M5414 Radiculopathy, thoracic region: Secondary | ICD-10-CM | POA: Diagnosis not present

## 2016-05-18 DIAGNOSIS — M9901 Segmental and somatic dysfunction of cervical region: Secondary | ICD-10-CM | POA: Diagnosis not present

## 2016-05-18 DIAGNOSIS — M9902 Segmental and somatic dysfunction of thoracic region: Secondary | ICD-10-CM | POA: Diagnosis not present

## 2016-05-18 DIAGNOSIS — M531 Cervicobrachial syndrome: Secondary | ICD-10-CM | POA: Diagnosis not present

## 2016-05-18 DIAGNOSIS — M5414 Radiculopathy, thoracic region: Secondary | ICD-10-CM | POA: Diagnosis not present

## 2016-05-21 DIAGNOSIS — M9902 Segmental and somatic dysfunction of thoracic region: Secondary | ICD-10-CM | POA: Diagnosis not present

## 2016-05-21 DIAGNOSIS — M9901 Segmental and somatic dysfunction of cervical region: Secondary | ICD-10-CM | POA: Diagnosis not present

## 2016-05-21 DIAGNOSIS — M531 Cervicobrachial syndrome: Secondary | ICD-10-CM | POA: Diagnosis not present

## 2016-05-21 DIAGNOSIS — M5414 Radiculopathy, thoracic region: Secondary | ICD-10-CM | POA: Diagnosis not present

## 2016-05-24 DIAGNOSIS — M9901 Segmental and somatic dysfunction of cervical region: Secondary | ICD-10-CM | POA: Diagnosis not present

## 2016-05-24 DIAGNOSIS — M5414 Radiculopathy, thoracic region: Secondary | ICD-10-CM | POA: Diagnosis not present

## 2016-05-24 DIAGNOSIS — M9902 Segmental and somatic dysfunction of thoracic region: Secondary | ICD-10-CM | POA: Diagnosis not present

## 2016-05-24 DIAGNOSIS — M531 Cervicobrachial syndrome: Secondary | ICD-10-CM | POA: Diagnosis not present

## 2016-05-29 DIAGNOSIS — M9901 Segmental and somatic dysfunction of cervical region: Secondary | ICD-10-CM | POA: Diagnosis not present

## 2016-05-29 DIAGNOSIS — M531 Cervicobrachial syndrome: Secondary | ICD-10-CM | POA: Diagnosis not present

## 2016-05-29 DIAGNOSIS — M5414 Radiculopathy, thoracic region: Secondary | ICD-10-CM | POA: Diagnosis not present

## 2016-05-29 DIAGNOSIS — M9902 Segmental and somatic dysfunction of thoracic region: Secondary | ICD-10-CM | POA: Diagnosis not present

## 2016-05-31 DIAGNOSIS — M531 Cervicobrachial syndrome: Secondary | ICD-10-CM | POA: Diagnosis not present

## 2016-05-31 DIAGNOSIS — M9901 Segmental and somatic dysfunction of cervical region: Secondary | ICD-10-CM | POA: Diagnosis not present

## 2016-05-31 DIAGNOSIS — M9902 Segmental and somatic dysfunction of thoracic region: Secondary | ICD-10-CM | POA: Diagnosis not present

## 2016-05-31 DIAGNOSIS — M5414 Radiculopathy, thoracic region: Secondary | ICD-10-CM | POA: Diagnosis not present

## 2016-06-06 DIAGNOSIS — M9901 Segmental and somatic dysfunction of cervical region: Secondary | ICD-10-CM | POA: Diagnosis not present

## 2016-06-06 DIAGNOSIS — M5414 Radiculopathy, thoracic region: Secondary | ICD-10-CM | POA: Diagnosis not present

## 2016-06-06 DIAGNOSIS — M9902 Segmental and somatic dysfunction of thoracic region: Secondary | ICD-10-CM | POA: Diagnosis not present

## 2016-06-06 DIAGNOSIS — M531 Cervicobrachial syndrome: Secondary | ICD-10-CM | POA: Diagnosis not present

## 2016-06-08 DIAGNOSIS — M531 Cervicobrachial syndrome: Secondary | ICD-10-CM | POA: Diagnosis not present

## 2016-06-08 DIAGNOSIS — M9901 Segmental and somatic dysfunction of cervical region: Secondary | ICD-10-CM | POA: Diagnosis not present

## 2016-06-08 DIAGNOSIS — M5414 Radiculopathy, thoracic region: Secondary | ICD-10-CM | POA: Diagnosis not present

## 2016-06-08 DIAGNOSIS — M9902 Segmental and somatic dysfunction of thoracic region: Secondary | ICD-10-CM | POA: Diagnosis not present

## 2016-06-12 DIAGNOSIS — M9902 Segmental and somatic dysfunction of thoracic region: Secondary | ICD-10-CM | POA: Diagnosis not present

## 2016-06-12 DIAGNOSIS — M5414 Radiculopathy, thoracic region: Secondary | ICD-10-CM | POA: Diagnosis not present

## 2016-06-12 DIAGNOSIS — M9901 Segmental and somatic dysfunction of cervical region: Secondary | ICD-10-CM | POA: Diagnosis not present

## 2016-06-12 DIAGNOSIS — M531 Cervicobrachial syndrome: Secondary | ICD-10-CM | POA: Diagnosis not present

## 2016-06-15 DIAGNOSIS — M9901 Segmental and somatic dysfunction of cervical region: Secondary | ICD-10-CM | POA: Diagnosis not present

## 2016-06-15 DIAGNOSIS — M531 Cervicobrachial syndrome: Secondary | ICD-10-CM | POA: Diagnosis not present

## 2016-06-15 DIAGNOSIS — M9902 Segmental and somatic dysfunction of thoracic region: Secondary | ICD-10-CM | POA: Diagnosis not present

## 2016-06-15 DIAGNOSIS — M5414 Radiculopathy, thoracic region: Secondary | ICD-10-CM | POA: Diagnosis not present

## 2016-06-18 DIAGNOSIS — M5414 Radiculopathy, thoracic region: Secondary | ICD-10-CM | POA: Diagnosis not present

## 2016-06-18 DIAGNOSIS — M9902 Segmental and somatic dysfunction of thoracic region: Secondary | ICD-10-CM | POA: Diagnosis not present

## 2016-06-18 DIAGNOSIS — M531 Cervicobrachial syndrome: Secondary | ICD-10-CM | POA: Diagnosis not present

## 2016-06-18 DIAGNOSIS — M9901 Segmental and somatic dysfunction of cervical region: Secondary | ICD-10-CM | POA: Diagnosis not present

## 2016-06-20 DIAGNOSIS — M9902 Segmental and somatic dysfunction of thoracic region: Secondary | ICD-10-CM | POA: Diagnosis not present

## 2016-06-20 DIAGNOSIS — M5414 Radiculopathy, thoracic region: Secondary | ICD-10-CM | POA: Diagnosis not present

## 2016-06-20 DIAGNOSIS — M9901 Segmental and somatic dysfunction of cervical region: Secondary | ICD-10-CM | POA: Diagnosis not present

## 2016-06-20 DIAGNOSIS — M531 Cervicobrachial syndrome: Secondary | ICD-10-CM | POA: Diagnosis not present

## 2016-06-22 DIAGNOSIS — M9902 Segmental and somatic dysfunction of thoracic region: Secondary | ICD-10-CM | POA: Diagnosis not present

## 2016-06-22 DIAGNOSIS — M531 Cervicobrachial syndrome: Secondary | ICD-10-CM | POA: Diagnosis not present

## 2016-06-22 DIAGNOSIS — M9901 Segmental and somatic dysfunction of cervical region: Secondary | ICD-10-CM | POA: Diagnosis not present

## 2016-06-22 DIAGNOSIS — M5414 Radiculopathy, thoracic region: Secondary | ICD-10-CM | POA: Diagnosis not present

## 2016-07-25 DIAGNOSIS — M531 Cervicobrachial syndrome: Secondary | ICD-10-CM | POA: Diagnosis not present

## 2016-07-25 DIAGNOSIS — M9902 Segmental and somatic dysfunction of thoracic region: Secondary | ICD-10-CM | POA: Diagnosis not present

## 2016-07-25 DIAGNOSIS — M5414 Radiculopathy, thoracic region: Secondary | ICD-10-CM | POA: Diagnosis not present

## 2016-07-25 DIAGNOSIS — M9901 Segmental and somatic dysfunction of cervical region: Secondary | ICD-10-CM | POA: Diagnosis not present

## 2016-07-31 ENCOUNTER — Encounter: Payer: Self-pay | Admitting: Family Medicine

## 2016-09-03 DIAGNOSIS — M531 Cervicobrachial syndrome: Secondary | ICD-10-CM | POA: Diagnosis not present

## 2016-09-03 DIAGNOSIS — M9902 Segmental and somatic dysfunction of thoracic region: Secondary | ICD-10-CM | POA: Diagnosis not present

## 2016-09-03 DIAGNOSIS — M5414 Radiculopathy, thoracic region: Secondary | ICD-10-CM | POA: Diagnosis not present

## 2016-09-03 DIAGNOSIS — M9901 Segmental and somatic dysfunction of cervical region: Secondary | ICD-10-CM | POA: Diagnosis not present

## 2016-09-14 DIAGNOSIS — H0264 Xanthelasma of left upper eyelid: Secondary | ICD-10-CM | POA: Diagnosis not present

## 2016-10-01 DIAGNOSIS — M5414 Radiculopathy, thoracic region: Secondary | ICD-10-CM | POA: Diagnosis not present

## 2016-10-01 DIAGNOSIS — M9901 Segmental and somatic dysfunction of cervical region: Secondary | ICD-10-CM | POA: Diagnosis not present

## 2016-10-01 DIAGNOSIS — M531 Cervicobrachial syndrome: Secondary | ICD-10-CM | POA: Diagnosis not present

## 2016-10-01 DIAGNOSIS — M9902 Segmental and somatic dysfunction of thoracic region: Secondary | ICD-10-CM | POA: Diagnosis not present

## 2016-10-19 ENCOUNTER — Other Ambulatory Visit: Payer: Self-pay | Admitting: Family Medicine

## 2016-10-19 DIAGNOSIS — F5101 Primary insomnia: Secondary | ICD-10-CM

## 2016-10-19 MED ORDER — LORAZEPAM 0.5 MG PO TABS: 0.5000 mg | ORAL_TABLET | Freq: Every day | ORAL | 1 refills | Status: DC

## 2016-10-19 NOTE — Telephone Encounter (Signed)
03/2016 last ov and refill

## 2016-10-19 NOTE — Telephone Encounter (Signed)
Please call in refill of Lorazepam as approved.   

## 2016-10-19 NOTE — Telephone Encounter (Signed)
Called to [facility] 

## 2016-10-19 NOTE — Telephone Encounter (Signed)
PT is needing a refill on lorazapan .05mg  and she is going out of town on Tuesday so if she needs an appt please call as soon as possible to schedule   Best number [phone]

## 2016-11-20 DIAGNOSIS — H2513 Age-related nuclear cataract, bilateral: Secondary | ICD-10-CM | POA: Diagnosis not present

## 2016-11-20 DIAGNOSIS — H353131 Nonexudative age-related macular degeneration, bilateral, early dry stage: Secondary | ICD-10-CM | POA: Diagnosis not present

## 2016-11-20 DIAGNOSIS — D3131 Benign neoplasm of right choroid: Secondary | ICD-10-CM | POA: Diagnosis not present

## 2016-11-20 DIAGNOSIS — H25013 Cortical age-related cataract, bilateral: Secondary | ICD-10-CM | POA: Diagnosis not present

## 2016-11-24 DIAGNOSIS — M79641 Pain in right hand: Secondary | ICD-10-CM | POA: Diagnosis not present

## 2016-11-24 DIAGNOSIS — M67911 Unspecified disorder of synovium and tendon, right shoulder: Secondary | ICD-10-CM | POA: Diagnosis not present

## 2016-12-17 ENCOUNTER — Other Ambulatory Visit: Payer: Self-pay | Admitting: Obstetrics and Gynecology

## 2016-12-17 DIAGNOSIS — Z1231 Encounter for screening mammogram for malignant neoplasm of breast: Secondary | ICD-10-CM

## 2016-12-21 DIAGNOSIS — M67911 Unspecified disorder of synovium and tendon, right shoulder: Secondary | ICD-10-CM | POA: Diagnosis not present

## 2016-12-24 DIAGNOSIS — M67911 Unspecified disorder of synovium and tendon, right shoulder: Secondary | ICD-10-CM | POA: Diagnosis not present

## 2016-12-24 DIAGNOSIS — M25511 Pain in right shoulder: Secondary | ICD-10-CM | POA: Diagnosis not present

## 2016-12-25 ENCOUNTER — Ambulatory Visit
Admission: RE | Admit: 2016-12-25 | Discharge: 2016-12-25 | Disposition: A | Discharge status: Home / Self Care | Payer: Medicare Other | Source: Ambulatory Visit | Attending: Obstetrics and Gynecology | Admitting: Obstetrics and Gynecology

## 2016-12-25 DIAGNOSIS — Z1231 Encounter for screening mammogram for malignant neoplasm of breast: Secondary | ICD-10-CM

## 2016-12-26 DIAGNOSIS — M25511 Pain in right shoulder: Secondary | ICD-10-CM | POA: Diagnosis not present

## 2016-12-26 DIAGNOSIS — M67911 Unspecified disorder of synovium and tendon, right shoulder: Secondary | ICD-10-CM | POA: Diagnosis not present

## 2016-12-31 DIAGNOSIS — M67911 Unspecified disorder of synovium and tendon, right shoulder: Secondary | ICD-10-CM | POA: Diagnosis not present

## 2016-12-31 DIAGNOSIS — M25511 Pain in right shoulder: Secondary | ICD-10-CM | POA: Diagnosis not present

## 2017-01-02 DIAGNOSIS — M67911 Unspecified disorder of synovium and tendon, right shoulder: Secondary | ICD-10-CM | POA: Diagnosis not present

## 2017-01-02 DIAGNOSIS — M25511 Pain in right shoulder: Secondary | ICD-10-CM | POA: Diagnosis not present

## 2017-01-07 DIAGNOSIS — M67911 Unspecified disorder of synovium and tendon, right shoulder: Secondary | ICD-10-CM | POA: Diagnosis not present

## 2017-01-07 DIAGNOSIS — M25511 Pain in right shoulder: Secondary | ICD-10-CM | POA: Diagnosis not present

## 2017-01-09 DIAGNOSIS — M67911 Unspecified disorder of synovium and tendon, right shoulder: Secondary | ICD-10-CM | POA: Diagnosis not present

## 2017-01-09 DIAGNOSIS — M25511 Pain in right shoulder: Secondary | ICD-10-CM | POA: Diagnosis not present

## 2017-01-14 DIAGNOSIS — M25511 Pain in right shoulder: Secondary | ICD-10-CM | POA: Diagnosis not present

## 2017-01-14 DIAGNOSIS — M67911 Unspecified disorder of synovium and tendon, right shoulder: Secondary | ICD-10-CM | POA: Diagnosis not present

## 2017-01-16 DIAGNOSIS — M67911 Unspecified disorder of synovium and tendon, right shoulder: Secondary | ICD-10-CM | POA: Diagnosis not present

## 2017-01-16 DIAGNOSIS — M25511 Pain in right shoulder: Secondary | ICD-10-CM | POA: Diagnosis not present

## 2017-01-21 DIAGNOSIS — M67911 Unspecified disorder of synovium and tendon, right shoulder: Secondary | ICD-10-CM | POA: Diagnosis not present

## 2017-01-21 DIAGNOSIS — M25511 Pain in right shoulder: Secondary | ICD-10-CM | POA: Diagnosis not present

## 2017-01-23 DIAGNOSIS — M25511 Pain in right shoulder: Secondary | ICD-10-CM | POA: Diagnosis not present

## 2017-01-23 DIAGNOSIS — M67911 Unspecified disorder of synovium and tendon, right shoulder: Secondary | ICD-10-CM | POA: Diagnosis not present

## 2017-01-29 DIAGNOSIS — M67911 Unspecified disorder of synovium and tendon, right shoulder: Secondary | ICD-10-CM | POA: Diagnosis not present

## 2017-01-29 DIAGNOSIS — M25511 Pain in right shoulder: Secondary | ICD-10-CM | POA: Diagnosis not present

## 2017-01-31 DIAGNOSIS — M25511 Pain in right shoulder: Secondary | ICD-10-CM | POA: Diagnosis not present

## 2017-01-31 DIAGNOSIS — M67911 Unspecified disorder of synovium and tendon, right shoulder: Secondary | ICD-10-CM | POA: Diagnosis not present

## 2017-02-04 DIAGNOSIS — M67911 Unspecified disorder of synovium and tendon, right shoulder: Secondary | ICD-10-CM | POA: Diagnosis not present

## 2017-02-04 DIAGNOSIS — M25511 Pain in right shoulder: Secondary | ICD-10-CM | POA: Diagnosis not present

## 2017-02-06 DIAGNOSIS — M25511 Pain in right shoulder: Secondary | ICD-10-CM | POA: Diagnosis not present

## 2017-02-06 DIAGNOSIS — M67911 Unspecified disorder of synovium and tendon, right shoulder: Secondary | ICD-10-CM | POA: Diagnosis not present

## 2017-02-12 DIAGNOSIS — M67911 Unspecified disorder of synovium and tendon, right shoulder: Secondary | ICD-10-CM | POA: Diagnosis not present

## 2017-02-12 DIAGNOSIS — M25511 Pain in right shoulder: Secondary | ICD-10-CM | POA: Diagnosis not present

## 2017-02-14 DIAGNOSIS — M25511 Pain in right shoulder: Secondary | ICD-10-CM | POA: Diagnosis not present

## 2017-02-14 DIAGNOSIS — M67911 Unspecified disorder of synovium and tendon, right shoulder: Secondary | ICD-10-CM | POA: Diagnosis not present

## 2017-02-26 DIAGNOSIS — M25511 Pain in right shoulder: Secondary | ICD-10-CM | POA: Diagnosis not present

## 2017-02-26 DIAGNOSIS — M67911 Unspecified disorder of synovium and tendon, right shoulder: Secondary | ICD-10-CM | POA: Diagnosis not present

## 2017-03-08 ENCOUNTER — Telehealth: Payer: Self-pay

## 2017-03-08 NOTE — Telephone Encounter (Signed)
Called pt to schedule Medicare Annual Wellness Visit with nurse health advisor. -nr

## 2017-03-27 ENCOUNTER — Telehealth: Payer: Self-pay

## 2017-03-27 NOTE — Telephone Encounter (Signed)
Left vm for pt to call back to discuss upcoming appt. Need to reschedule her AWV for 04/05/17 or later -nr   Josepha Pigg, B.A.  Care Guide [phone]

## 2017-04-01 ENCOUNTER — Ambulatory Visit: Payer: Medicare Other

## 2017-04-05 ENCOUNTER — Encounter: Payer: Self-pay | Admitting: Physician Assistant

## 2017-04-05 ENCOUNTER — Ambulatory Visit (INDEPENDENT_AMBULATORY_CARE_PROVIDER_SITE_OTHER): Payer: Medicare Other

## 2017-04-05 ENCOUNTER — Ambulatory Visit (INDEPENDENT_AMBULATORY_CARE_PROVIDER_SITE_OTHER): Payer: Medicare Other | Admitting: Physician Assistant

## 2017-04-05 VITALS — BP 130/80 | HR 74 | Ht 65.0 in | Wt 182.2 lb

## 2017-04-05 VITALS — BP 138/86 | HR 97 | Temp 97.6°F | Resp 18 | Ht 65.0 in | Wt 182.2 lb

## 2017-04-05 DIAGNOSIS — E7800 Pure hypercholesterolemia, unspecified: Secondary | ICD-10-CM

## 2017-04-05 DIAGNOSIS — R0981 Nasal congestion: Secondary | ICD-10-CM

## 2017-04-05 DIAGNOSIS — M19079 Primary osteoarthritis, unspecified ankle and foot: Secondary | ICD-10-CM

## 2017-04-05 DIAGNOSIS — J069 Acute upper respiratory infection, unspecified: Secondary | ICD-10-CM | POA: Diagnosis not present

## 2017-04-05 DIAGNOSIS — R05 Cough: Secondary | ICD-10-CM

## 2017-04-05 DIAGNOSIS — S93509A Unspecified sprain of unspecified toe(s), initial encounter: Secondary | ICD-10-CM

## 2017-04-05 DIAGNOSIS — M79674 Pain in right toe(s): Secondary | ICD-10-CM | POA: Diagnosis not present

## 2017-04-05 DIAGNOSIS — Z23 Encounter for immunization: Secondary | ICD-10-CM

## 2017-04-05 DIAGNOSIS — M19071 Primary osteoarthritis, right ankle and foot: Secondary | ICD-10-CM | POA: Diagnosis not present

## 2017-04-05 DIAGNOSIS — J029 Acute pharyngitis, unspecified: Secondary | ICD-10-CM | POA: Diagnosis not present

## 2017-04-05 DIAGNOSIS — Z0000 Encounter for general adult medical examination without abnormal findings: Secondary | ICD-10-CM | POA: Diagnosis not present

## 2017-04-05 DIAGNOSIS — R058 Other specified cough: Secondary | ICD-10-CM

## 2017-04-05 MED ORDER — AZITHROMYCIN 250 MG PO TABS: ORAL_TABLET | ORAL | 0 refills | Status: DC

## 2017-04-05 NOTE — Progress Notes (Signed)
MRN: [id number] DOB: [birth year]  Subjective:   Brandy Leonard is a 71 y.o. female presenting for chief complaint of Sore Throat (X 2 weeks off and on); Sinus Problem (watery eyes); and Cough .  Reports 2 weeks history of sore throat, sinus congestion, rhinorrhea, sneezing and dry cough.Has tried robitussin cough syrup, theraflu, alkaselzter, flonase, allegra, and throat lozenges with no full relief. Denies fever, ear pain, wheezing, shortness of breath, chest pain and myalgia, night sweats, chills, decreased appetite, nausea, vomiting, abdominal pain and diarrhea. Has not had sick contact with anyone. Has history of seasonal allergies, especially when season changes. No history of asthma. Patient has had flu shot this season. Denies smoking. Notes when she feels like this the only thing that eventually helps is a zpack.   Would also like to talk about jammed toe. Notes she jammed her 2nd right toe on a post a month ago. Still having pain, swelling, and bruising. She tried buddy taping it with no relief. Most bothersome when she is walking around. Denies numbness, tingling, redness, and warmth.  Has not tried ice, heat, or over-the-counter NSAIDs.  Brandy Leonard has a current medication list which includes the following prescription(s): clobetasol cream, fluticasone, hydrocortisone, hydrocortisone, lorazepam, and azithromycin. Also is allergic to prednisone.  Brandy Leonard  has a past medical history of Allergy; GERD (gastroesophageal reflux disease); Hemorrhoids; Insomnia; and Lichen sclerosus of female genitalia. Also  has no past surgical history on file.   Objective:   Vitals: BP 138/86 (BP Location: Left Arm, Patient Position: Sitting, Cuff Size: Normal)   Pulse 97   Temp 97.6 F (36.4 C) (Oral)   Resp 18   Ht 5\' 5"  (1.651 m)   Wt 182 lb 3.2 oz (82.6 kg)   SpO2 98%   BMI 30.32 kg/m   Physical Exam  Constitutional: She is oriented to person, place, and time. She appears well-developed and  well-nourished. No distress.  HENT:  Head: Normocephalic and atraumatic.  Right Ear: Tympanic membrane, external ear and ear canal normal.  Left Ear: Tympanic membrane, external ear and ear canal normal.  Nose: Mucosal edema (mild bilaterally) present. No rhinorrhea. Right sinus exhibits maxillary sinus tenderness (mild). Right sinus exhibits no frontal sinus tenderness. Left sinus exhibits maxillary sinus tenderness (mild). Left sinus exhibits no frontal sinus tenderness.  Mouth/Throat: Uvula is midline and mucous membranes are normal. Posterior oropharyngeal erythema present. No tonsillar exudate.  Eyes: Conjunctivae are normal.  Neck: Normal range of motion.  Cardiovascular: Normal rate, regular rhythm, normal heart sounds and intact distal pulses.   Pulmonary/Chest: Effort normal and breath sounds normal. She has no wheezes. She has no rhonchi. She has no rales.  Musculoskeletal:       Feet:  Lymphadenopathy:       Head (right side): No submental, no submandibular, no tonsillar, no preauricular, no posterior auricular and no occipital adenopathy present.       Head (left side): No submental, no submandibular, no tonsillar, no preauricular, no posterior auricular and no occipital adenopathy present.    She has no cervical adenopathy.       Right: No supraclavicular adenopathy present.       Left: No supraclavicular adenopathy present.  Neurological: She is alert and oriented to person, place, and time.  Skin: Skin is warm and dry.  Psychiatric: She has a normal mood and affect.  Vitals reviewed.   No results found for this or any previous visit (from the past 24 hour(s)). Dg Toe 2nd  Right  Result Date: 04/05/2017 CLINICAL DATA:  Pain and swelling of right second toe. EXAM: RIGHT SECOND TOE COMPARISON:  Right foot x-rays dated December 01, 2014. FINDINGS: No acute fracture or malalignment. Scattered mild degenerative changes of the IP joints. Mild osteopenia. Soft tissues are  unremarkable. IMPRESSION: Negative. Electronically Signed   By: Titus Dubin M.D.   On: 04/05/2017 14:33    Assessment and Plan :  1. Sore throat 2. Sinus congestion 3. Dry cough 4. Acute upper respiratory infection Due to duration of symptoms and no improvement with consistent OTC symptomatic treatment, will treat empirically for underlying bacterial etiology at this time. Pt encouraged to return to clinic if symptoms worsen, do not improve, or as needed. - azithromycin (ZITHROMAX) 250 MG tablet; Take 2 tabs PO x 1 dose, then 1 tab PO QD x 4 days  Dispense: 6 tablet; Refill: 0  5. Pain of toe of right foot - DG Toe 2nd Right; Future 6. Sprain of toe, initial encounter 7. Osteoarthritis of toe Plain films show mild degenerative changes.  No acute fractures or malalignment.  Recommended applying heat to the affected area.  Also use over-the-counter anti-inflammatories as prescribed for 1 week for pain and swelling.  Given educational material for sports rehab for toe injuries. Return to clinic if symptoms worsen, do not improve, or as needed.  Consider orthopedic referral at that time if pain still persists.  Tenna Delaine, PA-C  [facility] 04/05/2017 6:48 PM

## 2017-04-05 NOTE — Patient Instructions (Addendum)
Brandy Leonard , Thank you for taking time to come for your Medicare Wellness Visit. I appreciate your ongoing commitment to your health goals. Please review the following plan we discussed and let me know if I can assist you in the future.   Screening recommendations/referrals: Colonoscopy: up to date, next due 05/21/2023 Mammogram: up to date, next due 12/26/2018 Bone Density: up to date, next due 06/11/2019 Recommended yearly ophthalmology/optometry visit for glaucoma screening and checkup Recommended yearly dental visit for hygiene and checkup  Vaccinations: Influenza vaccine: administered today  Pneumococcal vaccine: up to date Tdap vaccine: due, declined due to insurance Shingles vaccine: up to date    Advanced directives: Please bring a copy of your POA (Power of [place]) and/or Living Will to your next appointment.   Conditions/risks identified: Try to lose about 32 lbs in the near future. Go to www.[email or web address].com for ideas on different meal plans.   Next appointment: schedule follow up visit with PCP within 2 weeks, 1 year for AWV   Preventive Care 69 Years and Older, Female Preventive care refers to lifestyle choices and visits with your health care provider that can promote health and wellness. What does preventive care include?  A yearly physical exam. This is also called an annual well check.  Dental exams once or twice a year.  Routine eye exams. Ask your health care provider how often you should have your eyes checked.  Personal lifestyle choices, including:  Daily care of your teeth and gums.  Regular physical activity.  Eating a healthy diet.  Avoiding tobacco and drug use.  Limiting alcohol use.  Practicing safe sex.  Taking low-dose aspirin every day.  Taking vitamin and mineral supplements as recommended by your health care provider. What happens during an annual well check? The services and screenings done by your health care provider during your  annual well check will depend on your age, overall health, lifestyle risk factors, and family history of disease. Counseling  Your health care provider may ask you questions about your:  Alcohol use.  Tobacco use.  Drug use.  Emotional well-being.  Home and relationship well-being.  Sexual activity.  Eating habits.  History of falls.  Memory and ability to understand (cognition).  Work and work Statistician.  Reproductive health. Screening  You may have the following tests or measurements:  Height, weight, and BMI.  Blood pressure.  Lipid and cholesterol levels. These may be checked every 5 years, or more frequently if you are over 69 years old.  Skin check.  Lung cancer screening. You may have this screening every year starting at age 69 if you have a 30-pack-year history of smoking and currently smoke or have quit within the past 15 years.  Fecal occult blood test (FOBT) of the stool. You may have this test every year starting at age 69  Flexible sigmoidoscopy or colonoscopy. You may have a sigmoidoscopy every 5 years or a colonoscopy every 10 years starting at age 69.  Hepatitis C blood test.  Hepatitis B blood test.  Sexually transmitted disease (STD) testing.  Diabetes screening. This is done by checking your blood sugar (glucose) after you have not eaten for a while (fasting). You may have this done every 1-3 years.  Bone density scan. This is done to screen for osteoporosis. You may have this done starting at age 69.  Mammogram. This may be done every 1-2 years. Talk to your health care provider about how often you should have regular mammograms. Talk with  your health care provider about your test results, treatment options, and if necessary, the need for more tests. Vaccines  Your health care provider may recommend certain vaccines, such as:  Influenza vaccine. This is recommended every year.  Tetanus, diphtheria, and acellular pertussis (Tdap, Td)  vaccine. You may need a Td booster every 10 years.  Zoster vaccine. You may need this after age 69.  Pneumococcal 13-valent conjugate (PCV13) vaccine. One dose is recommended after age 69.  Pneumococcal polysaccharide (PPSV23) vaccine. One dose is recommended after age 69. Talk to your health care provider about which screenings and vaccines you need and how often you need them. This information is not intended to replace advice given to you by your health care provider. Make sure you discuss any questions you have with your health care provider. Document Released: 06/24/2015 Document Revised: 02/15/2016 Document Reviewed: 03/29/2015 Elsevier Interactive Patient Education  2017 [place] Prevention in the Home Falls can cause injuries. They can happen to people of all ages. There are many things you can do to make your home safe and to help prevent falls. What can I do on the outside of my home?  Regularly fix the edges of walkways and driveways and fix any cracks.  Remove anything that might make you trip as you walk through a door, such as a raised step or threshold.  Trim any bushes or trees on the path to your home.  Use bright outdoor lighting.  Clear any walking paths of anything that might make someone trip, such as rocks or tools.  Regularly check to see if handrails are loose or broken. Make sure that both sides of any steps have handrails.  Any raised decks and porches should have guardrails on the edges.  Have any leaves, snow, or ice cleared regularly.  Use sand or salt on walking paths during winter.  Clean up any spills in your garage right away. This includes oil or grease spills. What can I do in the bathroom?  Use night lights.  Install grab bars by the toilet and in the tub and shower. Do not use towel bars as grab bars.  Use non-skid mats or decals in the tub or shower.  If you need to sit down in the shower, use a plastic, non-slip  stool.  Keep the floor dry. Clean up any water that spills on the floor as soon as it happens.  Remove soap buildup in the tub or shower regularly.  Attach bath mats securely with double-sided non-slip rug tape.  Do not have throw rugs and other things on the floor that can make you trip. What can I do in the bedroom?  Use night lights.  Make sure that you have a light by your bed that is easy to reach.  Do not use any sheets or blankets that are too big for your bed. They should not hang down onto the floor.  Have a firm chair that has side arms. You can use this for support while you get dressed.  Do not have throw rugs and other things on the floor that can make you trip. What can I do in the kitchen?  Clean up any spills right away.  Avoid walking on wet floors.  Keep items that you use a lot in easy-to-reach places.  If you need to reach something above you, use a strong step stool that has a grab bar.  Keep electrical cords out of the way.  Do not  use floor polish or wax that makes floors slippery. If you must use wax, use non-skid floor wax.  Do not have throw rugs and other things on the floor that can make you trip. What can I do with my stairs?  Do not leave any items on the stairs.  Make sure that there are handrails on both sides of the stairs and use them. Fix handrails that are broken or loose. Make sure that handrails are as long as the stairways.  Check any carpeting to make sure that it is firmly attached to the stairs. Fix any carpet that is loose or worn.  Avoid having throw rugs at the top or bottom of the stairs. If you do have throw rugs, attach them to the floor with carpet tape.  Make sure that you have a light switch at the top of the stairs and the bottom of the stairs. If you do not have them, ask someone to add them for you. What else can I do to help prevent falls?  Wear shoes that:  Do not have high heels.  Have rubber bottoms.  Are  comfortable and fit you well.  Are closed at the toe. Do not wear sandals.  If you use a stepladder:  Make sure that it is fully opened. Do not climb a closed stepladder.  Make sure that both sides of the stepladder are locked into place.  Ask someone to hold it for you, if possible.  Clearly mark and make sure that you can see:  Any grab bars or handrails.  First and last steps.  Where the edge of each step is.  Use tools that help you move around (mobility aids) if they are needed. These include:  Canes.  Walkers.  Scooters.  Crutches.  Turn on the lights when you go into a dark area. Replace any light bulbs as soon as they burn out.  Set up your furniture so you have a clear path. Avoid moving your furniture around.  If any of your floors are uneven, fix them.  If there are any pets around you, be aware of where they are.  Review your medicines with your doctor. Some medicines can make you feel dizzy. This can increase your chance of falling. Ask your doctor what other things that you can do to help prevent falls. This information is not intended to replace advice given to you by your health care provider. Make sure you discuss any questions you have with your health care provider. Document Released: 03/24/2009 Document Revised: 11/03/2015 Document Reviewed: 07/02/2014 Elsevier Interactive Patient Education  2017 [employer].

## 2017-04-05 NOTE — Progress Notes (Signed)
Subjective:   Brandy Leonard is a 71 y.o. female who presents for Medicare Annual (Subsequent) preventive examination.  Review of Systems:  N/A Cardiac Risk Factors include: advanced age (>64men, >41 women);dyslipidemia;obesity (BMI >30kg/m2)     Objective:     Vitals: BP 130/80   Pulse 74   Ht 5\' 5"  (1.651 m)   Wt 182 lb 4 oz (82.7 kg)   SpO2 98%   BMI 30.33 kg/m   Body mass index is 30.33 kg/m.   Tobacco History  Smoking Status  . Former Smoker  Smokeless Tobacco  . Never Used     Counseling given: Not Answered   Past Medical History:  Diagnosis Date  . Allergy    Benadryl, Flonase  . GERD (gastroesophageal reflux disease)   . Hemorrhoids   . Insomnia   . Lichen sclerosus of female genitalia    History reviewed. No pertinent surgical history. Family History  Problem Relation Age of Onset  . Hypertension Mother   . Heart disease Mother   . Thyroid disease Father   . Cancer Father 66       Bladder cancer  . Stroke Father 59       CVA  . Hypertension Brother   . Stroke Maternal Grandmother   . Hypertension Maternal Grandmother   . Cancer Maternal Grandfather   . Hypertension Sister    History  Sexual Activity  . Sexual activity: No    Outpatient Encounter Prescriptions as of 04/05/2017  Medication Sig  . clobetasol cream (TEMOVATE) 7.82 % Apply 1 application topically 2 (two) times daily.  . fluticasone (FLONASE) 50 MCG/ACT nasal spray USE 1 TO 2 SPRAYS IN EACH NOSTRIL EVERY DAY (OFFICE VISIT NEEDED FOR ADDITIONAL REFILLS)  . hydrocortisone (ANUSOL-HC) 2.5 % rectal cream Place 1 application rectally 2 (two) times daily.  . hydrocortisone (ANUSOL-HC) 25 MG suppository Place 1 suppository (25 mg total) rectally 2 (two) times daily.  Marland Kitchen LORazepam (ATIVAN) 0.5 MG tablet Take 1-2 tablets (0.5-1 mg total) by mouth at bedtime.  . [DISCONTINUED] triamcinolone cream (KENALOG) 0.1 % Apply 1 application topically 2 (two) times daily.   No  facility-administered encounter medications on file as of 04/05/2017.     Activities of Daily Living In your present state of health, do you have any difficulty performing the following activities: 04/05/2017  Hearing? N  Vision? N  Difficulty concentrating or making decisions? Y  Comment Slight issues remembering things  Walking or climbing stairs? Y  Dressing or bathing? N  Doing errands, shopping? N  Preparing Food and eating ? N  Using the Toilet? N  In the past six months, have you accidently leaked urine? N  Do you have problems with loss of bowel control? N  Managing your Medications? N  Managing your Finances? N  Housekeeping or managing your Housekeeping? N  Some recent data might be hidden    Patient Care Team: Wardell Honour, MD as PCP - General (Family Medicine) Monna Fam, MD as Consulting Physician (Ophthalmology) Jackalyn Lombard, DMD (Dentistry) Brien Few, MD as Consulting Physician (Obstetrics and Gynecology)    Assessment:     Exercise Activities and Dietary recommendations Current Exercise Habits: Home exercise routine, Type of exercise: walking, Time (Minutes): 30, Frequency (Times/Week): 4, Weekly Exercise (Minutes/Week): 120, Intensity: Mild, Exercise limited by: None identified  Goals    . Weight (lb) < 152 lb (68.9 kg)          Patient would like to try to  lose about 32 lbs in the near future.      Fall Risk Fall Risk  04/05/2017 04/04/2016 01/17/2015 11/23/2013  Falls in the past year? No No No Yes  Number falls in past yr: - - - 1  Injury with Fall? - - - Yes  Comment - - - abrasion to RIGHT KNEE and RIGHT  ELBOW   Depression Screen PHQ 2/9 Scores 04/05/2017 04/04/2016 09/04/2015 05/24/2015  PHQ - 2 Score 0 0 0 0     Cognitive Function     6CIT Screen 04/05/2017  What Year? 0 points  What month? 0 points  What time? 0 points  Count back from 20 0 points  Months in reverse 0 points  Repeat phrase 2 points  Total Score 2     Immunization History  Administered Date(s) Administered  . Influenza,inj,Quad PF,6+ Mos 04/29/2013, 05/18/2014, 05/24/2015, 04/04/2016, 04/05/2017  . Pneumococcal Conjugate-13 01/17/2015  . Pneumococcal Polysaccharide-23 04/29/2013  . Zoster 06/11/2012   Screening Tests Health Maintenance  Topic Date Due  . TETANUS/TDAP  04/05/2018 (Originally 03/22/1965)  . MAMMOGRAM  12/26/2018  . COLONOSCOPY  05/21/2023  . INFLUENZA VACCINE  Completed  . DEXA SCAN  Completed  . Hepatitis C Screening  Completed  . PNA vac Low Risk Adult  Completed      Plan:   I have personally reviewed and noted the following in the patient's chart:   . Medical and social history . Use of alcohol, tobacco or illicit drugs  . Current medications and supplements . Functional ability and status . Nutritional status . Physical activity . Advanced directives . List of other physicians . Hospitalizations, surgeries, and ER visits in previous 12 months . Vitals . Screenings to include cognitive, depression, and falls . Referrals and appointments  In addition, I have reviewed and discussed with patient certain preventive protocols, quality metrics, and best practice recommendations. A written personalized care plan for preventive services as well as general preventive health recommendations were provided to patient.  Blood work ordered.    Andrez Grime, LPN  [id number]/02/[id number]

## 2017-04-05 NOTE — Patient Instructions (Addendum)
For your sinus pressure, sore throat, and cough, I have given you an Rx for azithromycin. I still recommend you continue taking allegra daily for your allergies.   Your toe xray was negative for acute fracture or malalignment. 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 I recommend applying heat to the affected toe and taking antiinflammatories for pain and swelling for the next week. You can use OTC ibuprofen as prescribed.  I also recommend avoiding weight bearing exercises but performing non weight bearing exercises like swimming to help with pain.If no improvement return to clinic or if you develop worsening symptoms.     Upper Respiratory Infection, Adult Most upper respiratory infections (URIs) are caused by a virus. A URI affects the nose, throat, and upper air passages. The most common type of URI is often called "the common cold." Follow these instructions at home:  Take medicines only as told by your doctor.  Gargle warm saltwater or take cough drops to comfort your throat as told by your doctor.  Use a warm mist humidifier or inhale steam from a shower to increase air moisture. This may make it easier to breathe.  Drink enough fluid to keep your pee (urine) clear or pale yellow.  Eat soups and other clear broths.  Have a healthy diet.  Rest as needed.  Go back to work when your fever is gone or your doctor says it is okay. ? You may need to stay home longer to avoid giving your URI to others. ? You can also wear a face mask and wash your hands often to prevent spread of the virus.  Use your inhaler more if you have asthma.  Do not use any tobacco products, including cigarettes, chewing tobacco, or electronic cigarettes. If you need help quitting, ask your doctor. Contact a doctor if:  You are getting worse, not better.  Your symptoms are not helped by medicine.  You have chills.  You are getting more short of  breath.  You have brown or red mucus.  You have yellow or brown discharge from your nose.  You have pain in your face, especially when you bend forward.  You have a fever.  You have puffy (swollen) neck glands.  You have pain while swallowing.  You have white areas in the back of your throat. Get help right away if:  You have very bad or constant: ? Headache. ? Ear pain. ? Pain in your forehead, behind your eyes, and over your cheekbones (sinus pain). ? Chest pain.  You have long-lasting (chronic) lung disease and any of the following: ? Wheezing. ? Long-lasting cough. ? Coughing up blood. ? A change in your usual mucus.  You have a stiff neck.  You have changes in your: ? Vision. ? Hearing. ? Thinking. ? Mood. This information is not intended to replace advice given to you by your health care provider. Make sure you discuss any questions you have with your health care provider. Document Released: 11/14/2007 Document Revised: 01/29/2016 Document Reviewed: 09/02/2013 Elsevier Interactive Patient Education  2018 [place]     Arthritis Arthritis means joint pain. It can also mean joint disease. A joint is a place where bones come together. People who have arthritis may have:  Red joints.  Swollen joints.  Stiff joints.  Warm joints.  A fever.  A feeling of being sick.  Follow these instructions at home: Pay attention to any changes in your symptoms.  Take these actions to help with your pain and swelling. Medicines  Take over-the-counter and prescription medicines only as told by your doctor.  Do not take aspirin for pain if your doctor says that you may have gout. Activity  Rest your joint if your doctor tells you to.  Avoid activities that make the pain worse.  Exercise your joint regularly as told by your doctor. Try doing exercises like: ? Swimming. ? Water aerobics. ? Biking. ? Walking. Joint Care   If your joint is swollen, keep  it raised (elevated) if told by your doctor.  If your joint feels stiff in the morning, try taking a warm shower.  If you have diabetes, do not apply heat without asking your doctor.  If told, apply heat to the joint: ? Put a towel between the joint and the hot pack or heating pad. ? Leave the heat on the area for 20-30 minutes.  If told, apply ice to the joint: ? Put ice in a plastic bag. ? Place a towel between your skin and the bag. ? Leave the ice on for 20 minutes, 2-3 times per day.  Keep all follow-up visits as told by your doctor. Contact a doctor if:  The pain gets worse.  You have a fever. Get help right away if:  You have very bad pain in your joint.  You have swelling in your joint.  Your joint is red.  Many joints become painful and swollen.  You have very bad back pain.  Your leg is very weak.  You cannot control your pee (urine) or poop (stool). This information is not intended to replace advice given to you by your health care provider. Make sure you discuss any questions you have with your health care provider. Document Released: 08/22/2009 Document Revised: 11/03/2015 Document Reviewed: 08/23/2014 Elsevier Interactive Patient Education  2018 [place].   Toe Exercises Stretching and range of motion exercises These exercises warm up your muscles and joints and improve the movement and flexibility of your foot. These exercises also help to relieve pain. Exercise A: Toe flexion  1. Sit with your left / rightleg crossed over your opposite knee. 2. Gently pull your big toe toward the bottom of your foot. You should feel a stretch on the top of your toe and foot. 3. Hold this position for __________ seconds. 4. Release your toe and return to the starting position. Repeat __________ times. Complete this exercise __________ times a day. Exercise B: Gastroc, standing  1. Stand with your hands against a wall. 2. Extend your left / right leg behind  you, and bend your front knee slightly. Your heels should be on the floor. 3. Keeping your heels on the floor, keep your back knee straight and shift your weight toward the wall. You should feel a gentle stretch in the back of your lower leg (calf). 4. Hold this position for __________ seconds. 5. Return to the starting position. Repeat __________ times. Complete this exercise __________ times a day. Exercise C: Soleus, standing 1. Stand with your hands against a wall. 2. Extend your left / right leg behind you, and bend your front knee slightly. Your heels should be on the floor. 3. Keeping your heels on the floor, bend your back knee and shift your weight slightly over your back leg. You should feel a gentle stretch deep in your calf. 4. Hold this position for __________ seconds. 5. Return to the starting position. Repeat __________ times. Complete this exercise  __________ times a day. Strengthening exercises These exercises build strength and endurance in your foot. Endurance is the ability to use your muscles for a long time, even after they get tired. Exercise D: Towel curls ( toe flexors) 1. Sit in a chair on a non-carpeted surface, and put your feet on the floor. 2. Place a towel in front of your feet. If told by your health care provider, add __________ to the end of the towel. 3. Keeping your heel on the floor, put your left / right foot on the towel. 4. Pull the towel toward your heel by grabbing the towel with your left / right toes and curling them under. Keep your heel on the floor while you do this. 5. Let your toes relax. 6. Grab the towel again. Keep going until the towel is completely underneath your foot. Repeat __________ times. Complete this exercise __________ times a day. Exercise E: Arch lifts ( foot intrinsics) 1. Sit in a chair with your feet flat on the floor. 2. Keeping your big toe and your heel on the floor, lift only your arch, which is on the inner edge of  your left / right foot. Do not move your knees or scrunch your toes. This is a small movement. 3. Hold this position for __________ seconds. 4. Slowly return to the starting position. Repeat __________ times. Complete this exercise __________ times a day. This information is not intended to replace advice given to you by your health care provider. Make sure you discuss any questions you have with your health care provider. Document Released: 05/28/2005 Document Revised: 02/02/2016 Document Reviewed: 04/11/2015 Elsevier Interactive Patient Education  2018 [employer].  IF you received an x-ray today, you will receive an invoice from [facility]. Please contact [facility] at [phone] with questions or concerns regarding your invoice.   IF you received labwork today, you will receive an invoice from [place]. Please contact [facility] at [phone] with questions or concerns regarding your invoice.   Our billing staff will not be able to assist you with questions regarding bills from these companies.  You will be contacted with the lab results as soon as they are available. The fastest way to get your results is to activate your My Chart account. Instructions are located on the last page of this paperwork. If you have not heard from [place] regarding the results in 2 weeks, please contact this office.

## 2017-04-06 LAB — CBC WITH DIFFERENTIAL/PLATELET
Basophils Absolute: 0 10*3/uL (ref 0.0–0.2)
Basos: 0 %
EOS (ABSOLUTE): 0.6 10*3/uL — ABNORMAL HIGH (ref 0.0–0.4)
Eos: 6 %
Hematocrit: 39.2 % (ref 34.0–46.6)
Hemoglobin: 12.9 g/dL (ref 11.1–15.9)
Immature Grans (Abs): 0 10*3/uL (ref 0.0–0.1)
Immature Granulocytes: 0 %
Lymphocytes Absolute: 1.6 10*3/uL (ref 0.7–3.1)
Lymphs: 18 %
MCH: 27.6 pg (ref 26.6–33.0)
MCHC: 32.9 g/dL (ref 31.5–35.7)
MCV: 84 fL (ref 79–97)
Monocytes Absolute: 0.6 10*3/uL (ref 0.1–0.9)
Monocytes: 6 %
Neutrophils Absolute: 6.3 10*3/uL (ref 1.4–7.0)
Neutrophils: 70 %
Platelets: 292 10*3/uL (ref 150–379)
RBC: 4.68 x10E6/uL (ref 3.77–5.28)
RDW: 13.5 % (ref 12.3–15.4)
WBC: 9.1 10*3/uL (ref 3.4–10.8)

## 2017-04-06 LAB — LIPID PANEL
Chol/HDL Ratio: 3.9 ratio (ref 0.0–4.4)
Cholesterol, Total: 210 mg/dL — ABNORMAL HIGH (ref 100–199)
HDL: 54 mg/dL (ref >39)
LDL Calculated: 139 mg/dL — ABNORMAL HIGH (ref 0–99)
Triglycerides: 86 mg/dL (ref 0–149)
VLDL Cholesterol Cal: 17 mg/dL (ref 5–40)

## 2017-04-06 LAB — COMPREHENSIVE METABOLIC PANEL
ALT: 23 IU/L (ref 0–32)
AST: 23 IU/L (ref 0–40)
Albumin/Globulin Ratio: 1.6 (ref 1.2–2.2)
Albumin: 4.3 g/dL (ref 3.6–4.8)
Alkaline Phosphatase: 103 IU/L (ref 39–117)
BUN/Creatinine Ratio: 23 (ref 12–28)
BUN: 14 mg/dL (ref 8–27)
Bilirubin Total: 0.3 mg/dL (ref 0.0–1.2)
CO2: 23 mmol/L (ref 20–29)
Calcium: 9.4 mg/dL (ref 8.7–10.3)
Chloride: 102 mmol/L (ref 96–106)
Creatinine, Ser: 0.62 mg/dL (ref 0.57–1.00)
GFR calc Af Amer: 106 mL/min/{1.73_m2} (ref >59)
GFR calc non Af Amer: 92 mL/min/{1.73_m2} (ref >59)
Globulin, Total: 2.7 g/dL (ref 1.5–4.5)
Glucose: 98 mg/dL (ref 65–99)
Potassium: 4.1 mmol/L (ref 3.5–5.2)
Sodium: 143 mmol/L (ref 134–144)
Total Protein: 7 g/dL (ref 6.0–8.5)

## 2017-04-17 ENCOUNTER — Encounter: Payer: Self-pay | Admitting: Family Medicine

## 2017-04-17 ENCOUNTER — Ambulatory Visit (INDEPENDENT_AMBULATORY_CARE_PROVIDER_SITE_OTHER): Payer: Medicare Other | Admitting: Family Medicine

## 2017-04-17 VITALS — BP 126/82 | HR 107 | Temp 98.7°F | Resp 17 | Ht 65.0 in | Wt 183.6 lb

## 2017-04-17 DIAGNOSIS — E2839 Other primary ovarian failure: Secondary | ICD-10-CM

## 2017-04-17 DIAGNOSIS — J029 Acute pharyngitis, unspecified: Secondary | ICD-10-CM

## 2017-04-17 DIAGNOSIS — J301 Allergic rhinitis due to pollen: Secondary | ICD-10-CM | POA: Diagnosis not present

## 2017-04-17 DIAGNOSIS — K219 Gastro-esophageal reflux disease without esophagitis: Secondary | ICD-10-CM

## 2017-04-17 DIAGNOSIS — S0083XA Contusion of other part of head, initial encounter: Secondary | ICD-10-CM | POA: Diagnosis not present

## 2017-04-17 DIAGNOSIS — S1093XA Contusion of unspecified part of neck, initial encounter: Secondary | ICD-10-CM | POA: Diagnosis not present

## 2017-04-17 DIAGNOSIS — K640 First degree hemorrhoids: Secondary | ICD-10-CM

## 2017-04-17 DIAGNOSIS — F5101 Primary insomnia: Secondary | ICD-10-CM | POA: Diagnosis not present

## 2017-04-17 DIAGNOSIS — S0003XA Contusion of scalp, initial encounter: Secondary | ICD-10-CM

## 2017-04-17 DIAGNOSIS — S20211A Contusion of right front wall of thorax, initial encounter: Secondary | ICD-10-CM

## 2017-04-17 DIAGNOSIS — W101XXA Fall (on)(from) sidewalk curb, initial encounter: Secondary | ICD-10-CM | POA: Diagnosis not present

## 2017-04-17 DIAGNOSIS — N904 Leukoplakia of vulva: Secondary | ICD-10-CM

## 2017-04-17 MED ORDER — ZOSTER VAC RECOMB ADJUVANTED 50 MCG/0.5ML IM SUSR: 0.5000 mL | Freq: Once | INTRAMUSCULAR | 1 refills | Status: AC

## 2017-04-17 MED ORDER — AZELASTINE HCL 0.1 % NA SOLN: 2.0000 | Freq: Two times a day (BID) | NASAL | 12 refills | Status: DC

## 2017-04-17 MED ORDER — HYDROCORTISONE 2.5 % EX OINT: TOPICAL_OINTMENT | Freq: Two times a day (BID) | CUTANEOUS | 5 refills | Status: AC

## 2017-04-17 MED ORDER — FLUTICASONE PROPIONATE 50 MCG/ACT NA SUSP: NASAL | 11 refills | Status: DC

## 2017-04-17 MED ORDER — HYDROCORTISONE 2.5 % RE CREA: 1.0000 "application " | TOPICAL_CREAM | Freq: Two times a day (BID) | RECTAL | 2 refills | Status: AC

## 2017-04-17 MED ORDER — LORAZEPAM 0.5 MG PO TABS: 0.5000 mg | ORAL_TABLET | Freq: Every day | ORAL | 1 refills | Status: DC

## 2017-04-17 MED ORDER — RANITIDINE HCL 150 MG PO TABS: 150.0000 mg | ORAL_TABLET | Freq: Two times a day (BID) | ORAL | 1 refills | Status: DC

## 2017-04-17 MED ORDER — HYDROCORTISONE ACETATE 25 MG RE SUPP: 25.0000 mg | Freq: Two times a day (BID) | RECTAL | 1 refills | Status: AC
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
## 2017-04-17 NOTE — Patient Instructions (Addendum)
   IF you received an x-ray today, you will receive an invoice from [facility]. Please contact [facility] at [phone] with questions or concerns regarding your invoice.   IF you received labwork today, you will receive an invoice from [facility]. Please contact [facility] at [phone] with questions or concerns regarding your invoice.   Our billing staff will not be able to assist you with questions regarding bills from these companies.  You will be contacted with the lab results as soon as they are available. The fastest way to get your results is to activate your My Chart account. Instructions are located on the last page of this paperwork. If you have not heard from us regarding the results in 2 weeks, please contact this office.      Preventive Care 65 Years and Older, Female Preventive care refers to lifestyle choices and visits with your health care provider that can promote health and wellness. What does preventive care include?  A yearly physical exam. This is also called an annual well check.  Dental exams once or twice a year.  Routine eye exams. Ask your health care provider how often you should have your eyes checked.  Personal lifestyle choices, including: ? Daily care of your teeth and gums. ? Regular physical activity. ? Eating a healthy diet. ? Avoiding tobacco and drug use. ? Limiting alcohol use. ? Practicing safe sex. ? Taking low-dose aspirin every day. ? Taking vitamin and mineral supplements as recommended by your health care provider. What happens during an annual well check? The services and screenings done by your health care provider during your annual well check will depend on your age, overall health, lifestyle risk factors, and family history of disease. Counseling Your health care provider may ask you questions about your:  Alcohol use.  Tobacco use.  Drug use.  Emotional well-being.  Home and relationship  well-being.  Sexual activity.  Eating habits.  History of falls.  Memory and ability to understand (cognition).  Work and work environment.  Reproductive health.  Screening You may have the following tests or measurements:  Height, weight, and BMI.  Blood pressure.  Lipid and cholesterol levels. These may be checked every 5 years, or more frequently if you are over 50 years old.  Skin check.  Lung cancer screening. You may have this screening every year starting at age 55 if you have a 30-pack-year history of smoking and currently smoke or have quit within the past 15 years.  Fecal occult blood test (FOBT) of the stool. You may have this test every year starting at age 50.  Flexible sigmoidoscopy or colonoscopy. You may have a sigmoidoscopy every 5 years or a colonoscopy every 10 years starting at age 50.  Hepatitis C blood test.  Hepatitis B blood test.  Sexually transmitted disease (STD) testing.  Diabetes screening. This is done by checking your blood sugar (glucose) after you have not eaten for a while (fasting). You may have this done every 1-3 years.  Bone density scan. This is done to screen for osteoporosis. You may have this done starting at age 65.  Mammogram. This may be done every 1-2 years. Talk to your health care provider about how often you should have regular mammograms.  Talk with your health care provider about your test results, treatment options, and if necessary, the need for more tests. Vaccines Your health care provider may recommend certain vaccines, such as:  Influenza vaccine. This is recommended every year.    diphtheria, and acellular pertussis (Tdap, Td) vaccine. You may need a Td booster every 10 years.  Varicella vaccine. You may need this if you have not been vaccinated.  Zoster vaccine. You may need this after age 44.  Measles, mumps, and rubella (MMR) vaccine. You may need at least one dose of MMR if you were born in  1957 or later. You may also need a second dose.  Pneumococcal 13-valent conjugate (PCV13) vaccine. One dose is recommended after age 44.  Pneumococcal polysaccharide (PPSV23) vaccine. One dose is recommended after age 77.  Meningococcal vaccine. You may need this if you have certain conditions.  Hepatitis A vaccine. You may need this if you have certain conditions or if you travel or work in places where you may be exposed to hepatitis A.  Hepatitis B vaccine. You may need this if you have certain conditions or if you travel or work in places where you may be exposed to hepatitis B.  Haemophilus influenzae type b (Hib) vaccine. You may need this if you have certain conditions.  Talk to your health care provider about which screenings and vaccines you need and how often you need them. This information is not intended to replace advice given to you by your health care provider. Make sure you discuss any questions you have with your health care provider. Document Released: 06/24/2015 Document Revised: 02/15/2016 Document Reviewed: 03/29/2015 Elsevier Interactive Patient Education  2017 [employer].

## 2017-06-06 ENCOUNTER — Ambulatory Visit
Admission: RE | Admit: 2017-06-06 | Discharge: 2017-06-06 | Disposition: A | Discharge status: Home / Self Care | Payer: Medicare Other | Source: Ambulatory Visit | Attending: Family Medicine | Admitting: Family Medicine

## 2017-06-06 DIAGNOSIS — Z780 Asymptomatic menopausal state: Secondary | ICD-10-CM | POA: Diagnosis not present

## 2017-06-06 DIAGNOSIS — M8589 Other specified disorders of bone density and structure, multiple sites: Secondary | ICD-10-CM | POA: Diagnosis not present

## 2017-06-06 DIAGNOSIS — E2839 Other primary ovarian failure: Secondary | ICD-10-CM

## 2017-06-13 DIAGNOSIS — J310 Chronic rhinitis: Secondary | ICD-10-CM | POA: Diagnosis not present

## 2017-06-13 DIAGNOSIS — R1313 Dysphagia, pharyngeal phase: Secondary | ICD-10-CM | POA: Diagnosis not present

## 2017-09-28 ENCOUNTER — Encounter (HOSPITAL_COMMUNITY): Payer: Self-pay | Admitting: Emergency Medicine

## 2017-09-28 ENCOUNTER — Ambulatory Visit (HOSPITAL_COMMUNITY)
Admission: EM | Admit: 2017-09-28 | Discharge: 2017-09-28 | Disposition: A | Discharge status: Home / Self Care | Payer: Medicare Other | Attending: Family Medicine | Admitting: Family Medicine

## 2017-09-28 DIAGNOSIS — J019 Acute sinusitis, unspecified: Secondary | ICD-10-CM

## 2017-09-28 MED ORDER — DOXYCYCLINE HYCLATE 100 MG PO CAPS: 100.0000 mg | ORAL_CAPSULE | Freq: Two times a day (BID) | ORAL | 0 refills | Status: DC

## 2017-09-28 MED ORDER — BENZONATATE 100 MG PO CAPS: 100.0000 mg | ORAL_CAPSULE | Freq: Three times a day (TID) | ORAL | 0 refills | Status: DC

## 2017-09-28 NOTE — ED Triage Notes (Signed)
Pt c/o coughing, allergies, facial soreness, drainage, x6 months.

## 2017-09-28 NOTE — Discharge Instructions (Addendum)
Continue with your allergy treatments as previously recommended. Push fluids to ensure adequate hydration and keep secretions thin.  Complete course of antibiotics.   May try use of tessalon capsules as needed for cough. If symptoms worsen or do not improve in the next week to return to be seen or to follow up with your PCP.

## 2017-09-28 NOTE — ED Provider Notes (Signed)
[facility]    CSN: [id number] Arrival date & time: 09/28/17  1202     History   Chief Complaint Chief Complaint  Patient presents with  . Allergies  . Cough    HPI Brandy Leonard is a 72 y.o. female.   Brandy Leonard presents with complaints of sore throat, cough, occasional headache, runny eyes when outside, congestion and allergy symptoms. This has been intermittent for the past 6 months but worse over the past week. Saw ENT approximately 2 months ago and was given prednisone which briefly helped. No known fevers. No known ill contacts. Denies chest pain , shortness of breath , or fevers. Cough is dry. Does not smoke. Has been taking mucinex, theraflu, mucinex, nasal spray, zyrtec, advil allergy. Hx gerd, hemorrhoids, insomnia. No history of htn. Per chart review HR similar at last PCP visit.     ROS per HPI.      Past Medical History:  Diagnosis Date  . Allergy    Benadryl, Flonase  . GERD (gastroesophageal reflux disease)   . Hemorrhoids   . Insomnia   . Lichen sclerosus of female genitalia     Patient Active Problem List   Diagnosis Date Noted  . First degree hemorrhoids 02/08/2015  . Lichen sclerosus of female genitalia 02/08/2015  . Allergic rhinitis due to pollen 02/08/2015  . Insomnia 02/26/2012    History reviewed. No pertinent surgical history.  OB History   None      Home Medications    Prior to Admission medications   Medication Sig Start Date End Date Taking? Authorizing Provider  azelastine (ASTELIN) 0.1 % nasal spray Place 2 sprays 2 (two) times daily into both nostrils. Use in each nostril as directed 04/17/17   Wardell Honour, MD  benzonatate (TESSALON) 100 MG capsule Take 1 capsule (100 mg total) by mouth every 8 (eight) hours. 09/28/17   Zigmund Gottron, NP  clobetasol cream (TEMOVATE) 1.30 % Apply 1 application topically 2 (two) times daily. 04/04/16   Wardell Honour, MD  doxycycline (VIBRAMYCIN) 100 MG capsule Take 1 capsule  (100 mg total) by mouth 2 (two) times daily. 09/28/17   Zigmund Gottron, NP  fluticasone (FLONASE) 50 MCG/ACT nasal spray USE 1 TO 2 SPRAYS IN EACH NOSTRIL EVERY DAY (OFFICE VISIT NEEDED FOR ADDITIONAL REFILLS) 04/17/17   Wardell Honour, MD  hydrocortisone (ANUSOL-HC) 2.5 % rectal cream Place 1 application 2 (two) times daily rectally. 04/17/17   Wardell Honour, MD  hydrocortisone (ANUSOL-HC) 25 MG suppository Place 1 suppository (25 mg total) 2 (two) times daily rectally. 04/17/17   Wardell Honour, MD  hydrocortisone 2.5 % ointment Apply 2 (two) times daily topically. 04/17/17   Wardell Honour, MD  LORazepam (ATIVAN) 0.5 MG tablet Take 1-2 tablets (0.5-1 mg total) at bedtime by mouth. 04/17/17   Wardell Honour, MD  ranitidine (ZANTAC) 150 MG tablet Take 1 tablet (150 mg total) 2 (two) times daily by mouth. 04/17/17   Wardell Honour, MD    Family History Family History  Problem Relation Age of Onset  . Hypertension Mother   . Heart disease Mother   . Thyroid disease Father   . Cancer Father 29       Bladder cancer  . Stroke Father 49       CVA  . Hypertension Brother   . Stroke Maternal Grandmother   . Hypertension Maternal Grandmother   . Cancer Maternal Grandfather   . Hypertension Sister  Social History Social History   Tobacco Use  . Smoking status: Former Research scientist (life sciences)  . Smokeless tobacco: Never Used  Substance Use Topics  . Alcohol use: Yes    Comment: Occasionally   . Drug use: No     Allergies   Prednisone   Review of Systems Review of Systems   Physical Exam Triage Vital Signs ED Triage Vitals [09/28/17 1229]  Enc Vitals Group     BP (!) 163/105     Pulse Rate (!) 106     Resp 18     Temp 99.1 F (37.3 C)     Temp src      SpO2 95 %     Weight      Height      Head Circumference      Peak Flow      Pain Score      Pain Loc      Pain Edu?      Excl. in [place]?    No data found.  Updated Vital Signs BP (!) 163/105   Pulse (!) 106   Temp 99.1 F  (37.3 C)   Resp 18   SpO2 95%   Visual Acuity Right Eye Distance:   Left Eye Distance:   Bilateral Distance:    Right Eye Near:   Left Eye Near:    Bilateral Near:     Physical Exam  Constitutional: She is oriented to person, place, and time. She appears well-developed and well-nourished. No distress.  HENT:  Head: Normocephalic and atraumatic.  Right Ear: Tympanic membrane, external ear and ear canal normal.  Left Ear: Tympanic membrane, external ear and ear canal normal.  Nose: Nose normal.  Mouth/Throat: Uvula is midline, oropharynx is clear and moist and mucous membranes are normal. No tonsillar exudate.  Eyes: Pupils are equal, round, and reactive to light. Conjunctivae and EOM are normal.  Cardiovascular: Regular rhythm and normal heart sounds. Tachycardia present.  Pulmonary/Chest: Effort normal and breath sounds normal.  Occasional dry cough noted; lung clear to auscultation   Neurological: She is alert and oriented to person, place, and time.  Skin: Skin is warm and dry.     UC Treatments / Results  Labs (all labs ordered are listed, but only abnormal results are displayed) Labs Reviewed - No data to display  EKG None Radiology No results found.  Procedures Procedures (including critical care time)  Medications Ordered in UC Medications - No data to display   Initial Impression / Assessment and Plan / UC Course  I have reviewed the triage vital signs and the nursing notes.  Pertinent labs & imaging results that were available during my care of the patient were reviewed by me and considered in my medical decision making (see chart for details).     Mild tachycardia noted, per chart review this is not necessarily new for patient. Non toxic in appearance. Afebrile. Without distress. Lungs clear on exam. Continue with allergy treatment as this is likely contributing. Course of doxycycline provided at this time as well. Encouraged recheck with PCP in the  next week, return sooner if worsening. Patient verbalized understanding and agreeable to plan.    Final Clinical Impressions(s) / UC Diagnoses   Final diagnoses:  Acute sinusitis, recurrence not specified, unspecified location    ED Discharge Orders        Ordered    doxycycline (VIBRAMYCIN) 100 MG capsule  2 times daily     09/28/17 1308  benzonatate (TESSALON) 100 MG capsule  Every 8 hours     09/28/17 1308       Controlled Substance Prescriptions [place] Controlled Substance Registry consulted? Not Applicable   Zigmund Gottron, NP 09/28/17 1314

## 2017-10-22 ENCOUNTER — Other Ambulatory Visit: Payer: Self-pay | Admitting: Family Medicine

## 2017-10-22 DIAGNOSIS — F5101 Primary insomnia: Secondary | ICD-10-CM

## 2017-10-23 NOTE — Telephone Encounter (Signed)
Ativan 0.5 mg  Refill request  LOV 06/17/17 with Dr. Tamala Julian  Last refill:  04/17/17  #100   1 refill  [facility] - [address], [place] - [address]

## 2017-10-25 ENCOUNTER — Telehealth: Payer: Self-pay | Admitting: Family Medicine

## 2017-10-25 NOTE — Telephone Encounter (Signed)
Copied from [place] [phone]. Topic: Quick Communication - Rx Refill/Question >> Oct 25, 2017  2:58 PM Neva Seat wrote: LORazepam (ATIVAN) 0.5 MG tablet  Needing refills  [facility] [address] [place] [address] [place] [place] [address] Phone: [phone] Fax: [phone]

## 2017-10-25 NOTE — Telephone Encounter (Signed)
Rx pended at office for provider review.

## 2017-10-29 NOTE — Telephone Encounter (Signed)
Incoming call from [place] at call center. She states patient is calling in to check on status of Ativan refill. Patient is taking her last pill today and is nervous about running out of medication. Will re-send request to provider to fill medication.

## 2017-10-29 NOTE — Telephone Encounter (Signed)
Pt notified that refill was sent to [facility] on [address]

## 2017-10-29 NOTE — Telephone Encounter (Signed)
This is a duplicate telephone encounter for patient request. See refill encounter dated 10/22/17.

## 2017-10-29 NOTE — Telephone Encounter (Signed)
Please call --- I have sent in her refill of Ativan to [facility].

## 2017-10-29 NOTE — Telephone Encounter (Signed)
Pt called in to follow up on her refill request. Pt says that she is down to 1 pill.

## 2017-11-05 ENCOUNTER — Encounter: Payer: Self-pay | Admitting: Family Medicine

## 2017-11-25 DIAGNOSIS — H353131 Nonexudative age-related macular degeneration, bilateral, early dry stage: Secondary | ICD-10-CM | POA: Diagnosis not present

## 2017-11-25 DIAGNOSIS — H47321 Drusen of optic disc, right eye: Secondary | ICD-10-CM | POA: Diagnosis not present

## 2017-11-25 DIAGNOSIS — H25013 Cortical age-related cataract, bilateral: Secondary | ICD-10-CM | POA: Diagnosis not present

## 2017-11-25 DIAGNOSIS — H2513 Age-related nuclear cataract, bilateral: Secondary | ICD-10-CM | POA: Diagnosis not present

## 2017-12-15 ENCOUNTER — Encounter (HOSPITAL_COMMUNITY): Payer: Self-pay | Admitting: Emergency Medicine

## 2017-12-15 ENCOUNTER — Ambulatory Visit (HOSPITAL_COMMUNITY)
Admission: EM | Admit: 2017-12-15 | Discharge: 2017-12-15 | Disposition: A | Discharge status: Home / Self Care | Payer: Medicare Other | Attending: Family Medicine | Admitting: Family Medicine

## 2017-12-15 DIAGNOSIS — M25571 Pain in right ankle and joints of right foot: Secondary | ICD-10-CM | POA: Insufficient documentation

## 2017-12-15 DIAGNOSIS — K219 Gastro-esophageal reflux disease without esophagitis: Secondary | ICD-10-CM | POA: Insufficient documentation

## 2017-12-15 DIAGNOSIS — Z87891 Personal history of nicotine dependence: Secondary | ICD-10-CM | POA: Diagnosis not present

## 2017-12-15 DIAGNOSIS — Z79899 Other long term (current) drug therapy: Secondary | ICD-10-CM | POA: Insufficient documentation

## 2017-12-15 DIAGNOSIS — M7751 Other enthesopathy of right foot: Secondary | ICD-10-CM | POA: Insufficient documentation

## 2017-12-15 DIAGNOSIS — R300 Dysuria: Secondary | ICD-10-CM | POA: Insufficient documentation

## 2017-12-15 DIAGNOSIS — G4700 Insomnia, unspecified: Secondary | ICD-10-CM | POA: Insufficient documentation

## 2017-12-15 DIAGNOSIS — Z823 Family history of stroke: Secondary | ICD-10-CM | POA: Diagnosis not present

## 2017-12-15 DIAGNOSIS — M7750 Other enthesopathy of unspecified foot: Secondary | ICD-10-CM

## 2017-12-15 DIAGNOSIS — Z8249 Family history of ischemic heart disease and other diseases of the circulatory system: Secondary | ICD-10-CM | POA: Insufficient documentation

## 2017-12-15 DIAGNOSIS — R350 Frequency of micturition: Secondary | ICD-10-CM | POA: Diagnosis not present

## 2017-12-15 DIAGNOSIS — Z888 Allergy status to other drugs, medicaments and biological substances status: Secondary | ICD-10-CM | POA: Insufficient documentation

## 2017-12-15 LAB — POCT URINALYSIS DIP (DEVICE)
Bilirubin Urine: NEGATIVE
Glucose, UA: NEGATIVE mg/dL
Hgb urine dipstick: NEGATIVE
Ketones, ur: NEGATIVE mg/dL
Leukocytes, UA: NEGATIVE
Nitrite: NEGATIVE
Protein, ur: NEGATIVE mg/dL
Specific Gravity, Urine: 1.01 (ref 1.005–1.030)
Urobilinogen, UA: 0.2 mg/dL (ref 0.0–1.0)
pH: 6 (ref 5.0–8.0)

## 2017-12-15 NOTE — ED Provider Notes (Addendum)
[facility]   SUBJECTIVE:  Brandy Leonard is a 72 y.o. female who complains of urinary frequency, urgency and dysuria for the past week.  Patient admits to excessive caffeine intake.  Complains of lower abdomen discomfort.  Has tried OTC medications including AZO with relief.  Denies aggravating factors.  Admits to similar symptoms in the past.  Denies fever, chills, nausea, vomiting, abdominal pain, flank pain, abnormal vaginal discharge or bleeding, hematuria.    Patient also mentions right ankle pain that began about a week ago.  Denies precipitating event or injury.  Localizes pain to outside of ankle.  Describes as intermittent and "pinching" in character.  Denies aggravating or alleviating symptoms.  Denies erythema, effusion, ecchymosis, numbness/ tingling, or weakness.    LMP: No LMP recorded. Patient is postmenopausal.  ROS: As in HPI.  Past Medical History:  Diagnosis Date  . Allergy    Benadryl, Flonase  . GERD (gastroesophageal reflux disease)   . Hemorrhoids   . Insomnia   . Lichen sclerosus of female genitalia    History reviewed. No pertinent surgical history. Allergies  Allergen Reactions  . Prednisone Swelling    Of face   No current facility-administered medications on file prior to encounter.    Current Outpatient Medications on File Prior to Encounter  Medication Sig Dispense Refill  . azelastine (ASTELIN) 0.1 % nasal spray Place 2 sprays 2 (two) times daily into both nostrils. Use in each nostril as directed 30 mL 12  . benzonatate (TESSALON) 100 MG capsule Take 1 capsule (100 mg total) by mouth every 8 (eight) hours. 21 capsule 0  . clobetasol cream (TEMOVATE) 2.42 % Apply 1 application topically 2 (two) times daily. 30 g 11  . doxycycline (VIBRAMYCIN) 100 MG capsule Take 1 capsule (100 mg total) by mouth 2 (two) times daily. (Patient not taking: Reported on 12/15/2017) 20 capsule 0  . fluticasone (FLONASE) 50 MCG/ACT nasal spray USE 1 TO 2 SPRAYS  IN EACH NOSTRIL EVERY DAY (OFFICE VISIT NEEDED FOR ADDITIONAL REFILLS) 48 g 11  . hydrocortisone (ANUSOL-HC) 2.5 % rectal cream Place 1 application 2 (two) times daily rectally. 30 g 2  . hydrocortisone (ANUSOL-HC) 25 MG suppository Place 1 suppository (25 mg total) 2 (two) times daily rectally. 12 suppository 1  . hydrocortisone 2.5 % ointment Apply 2 (two) times daily topically. 30 g 5  . LORazepam (ATIVAN) 0.5 MG tablet Take 1 tablet (0.5 mg total) by mouth at bedtime as needed. 30 tablet 4  . ranitidine (ZANTAC) 150 MG tablet Take 1 tablet (150 mg total) 2 (two) times daily by mouth. 180 tablet 1   Social History   Socioeconomic History  . Marital status: Divorced    Spouse name: Not on file  . Number of children: Not on file  . Years of education: Not on file  . Highest education level: Not on file  Occupational History  . Not on file  Social Needs  . Financial resource strain: Not on file  . Food insecurity:    Worry: Not on file    Inability: Not on file  . Transportation needs:    Medical: Not on file    Non-medical: Not on file  Tobacco Use  . Smoking status: Former Research scientist (life sciences)  . Smokeless tobacco: Never Used  Substance and Sexual Activity  . Alcohol use: Yes    Comment: Occasionally   . Drug use: No  . Sexual activity: Never  Lifestyle  . Physical activity:  Days per week: Not on file    Minutes per session: Not on file  . Stress: Not on file  Relationships  . Social connections:    Talks on phone: Not on file    Gets together: Not on file    Attends religious service: Not on file    Active member of club or organization: Not on file    Attends meetings of clubs or organizations: Not on file    Relationship status: Not on file  . Intimate partner violence:    Fear of current or ex partner: Not on file    Emotionally abused: Not on file    Physically abused: Not on file    Forced sexual activity: Not on file  Other Topics Concern  . 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        Tobacco:  Quit smoking 1999.  Social smoker.       Alcohol:  Wine one glass per day.       Exercise:  Sporadic exercise.  Treadmill      Advanced Directive: +living will; HCPOA: brother Duffy Rhody). FULL CODE no prolonged measures.        Seatbelt:  100%;       ADLs: independent with ADLs. Drives.   Family History  Problem Relation Age of Onset  . Hypertension Mother   . Heart disease Mother   . Thyroid disease Father   . Cancer Father 51       Bladder cancer  . Stroke Father 61       CVA  . Hypertension Brother   . Stroke Maternal Grandmother   . Hypertension Maternal Grandmother   . Cancer Maternal Grandfather   . Hypertension Sister     OBJECTIVE:  Vitals:   12/15/17 1228  BP: 132/84  Pulse: (!) 102  Resp: 18  Temp: 98.7 F (37.1 C)  SpO2: 100%   General appearance: AOx3 in no acute distress HEENT: NCAT.  Oropharynx clear.  Lungs: clear to auscultation bilaterally without adventitious breath sounds Heart: regular rate and rhythm.  Radial pulses 2+ symmetrical bilaterally Abdomen: soft; non-distended; diffusely mildly tender suprapubic and about the LLQ; bowel sounds present;  no guarding or rebound tenderness Back: no CVA tenderness Extremities: no edema; symmetrical with no gross deformities;   Inspection: Skin clear and intact without obvious erythema, effusion, or ecchymosis.  Skin warm and dry to the touch  Palpation: Nontender to palpation; unable to reproduce symptoms  ROM: FROM active and passive  Strength: 5/5 knee flexion, 5/5 knee extension, 5/5 dorsiflexion, 5/5 plantar flexion Skin: warm and dry Neurologic: Ambulates from chair to exam table without difficulty Psychological: alert and cooperative; normal mood and affect  Labs Reviewed  URINE CULTURE  POCT  URINALYSIS DIP (DEVICE)    ASSESSMENT & PLAN:  1. Dysuria   2. Urinary frequency   3. Ankle tendinitis     No orders of the defined types were placed in this encounter.  Urine did not show signs of infection.  We will send it out to culture and call you with the results Continue to rest and push fluids Avoid excessive caffeine Follow up with PCP if symptoms persists Return or go to the ED if you have  any new or worsening symptoms Given strict return and ED precautions  Ace bandage applied Continue conservative management of rest, ice, and gentle stretches Take OTC ibuprofen or aleve as needed for symptomatic relief Follow up with PCP if symptoms persist Present to ER if worsening or new symptoms (fever, chills, chest pain, abdominal pain, changes in bowel or bladder habits, pain radiating into lower legs, etc...)    Outlined signs and symptoms indicating need for more acute intervention. Patient verbalized understanding. After Visit Summary given.     Lestine Box, PA-C 12/15/17 [place], [place], PA-C 12/15/17 1530

## 2017-12-15 NOTE — ED Triage Notes (Signed)
Pt c/o burning with urination, frequency, discomfort. Taking azo for symptoms x1 week. Pt also c/o pinch behind her R ankle. No injury.

## 2017-12-15 NOTE — Discharge Instructions (Addendum)
Urine did not show signs of infection.  We will send it out to culture and call you with the results Continue to rest and push fluids Avoid excessive caffeine Follow up with PCP if symptoms persists Return or go to the ED if you have any new or worsening symptoms  Ace bandage applied Continue conservative management of rest, ice, and gentle stretches Take OTC ibuprofen or aleve as needed for symptomatic relief Follow up with PCP if symptoms persist Present to ER if worsening or new symptoms (fever, chills, chest pain, abdominal pain, changes in bowel or bladder habits, pain radiating into lower legs, etc...)

## 2017-12-16 LAB — URINE CULTURE: Culture: <10000 — AB

## 2018-03-18 ENCOUNTER — Other Ambulatory Visit: Payer: Self-pay | Admitting: Family Medicine

## 2018-03-18 DIAGNOSIS — F5101 Primary insomnia: Secondary | ICD-10-CM

## 2018-03-18 MED ORDER — RANITIDINE HCL 150 MG PO TABS: 150.0000 mg | ORAL_TABLET | Freq: Two times a day (BID) | ORAL | 0 refills | Status: DC

## 2018-03-18 NOTE — Telephone Encounter (Signed)
Courtesy refill until appointment 04/18/18.

## 2018-03-18 NOTE — Telephone Encounter (Signed)
Left VM; pt needs to transfer care to another provider.

## 2018-03-18 NOTE — Telephone Encounter (Signed)
Requested medication (s) are due for refill today: Yes  Requested medication (s) are on the active medication list: Yes  Last refill:  10/29/17  Future visit scheduled: Yes; CPE/Establish care with [place]  Notes to clinic:  Unable to refill per protocol. Patient requests a 30 day supply until her appointment 04/18/18.     Requested Prescriptions  Pending Prescriptions Disp Refills   LORazepam (ATIVAN) 0.5 MG tablet 30 tablet 4    Sig: Take 1 tablet (0.5 mg total) by mouth at bedtime as needed.     Not Delegated - Psychiatry:  Anxiolytics/Hypnotics Failed - 03/18/2018  4:58 PM      Failed - This refill cannot be delegated      Failed - Urine Drug Screen completed in last 360 days.      Failed - Valid encounter within last 6 months    Recent Outpatient Visits          11 months ago Primary insomnia   Primary Care at [facility], Renette Butters, MD   11 months ago Sore throat   Primary Care at [facility], [place] D, PA-C   1 year ago Medicare annual wellness visit, subsequent   Primary Care at [facility], Renette Butters, MD   2 years ago Insomnia   Primary Care at [facility], MD   2 years ago Shingles   [facility] at [facility], Synetta Shadow, MD      Future Appointments            In 1 month Rutherford Guys, MD Primary Care at [place], [facility]         Signed Prescriptions Disp Refills   ranitidine (ZANTAC) 150 MG tablet 30 tablet 0    Sig: Take 1 tablet (150 mg total) by mouth 2 (two) times daily.     Gastroenterology:  H2 Antagonists Passed - 03/18/2018  4:58 PM      Passed - Valid encounter within last 12 months    Recent Outpatient Visits          11 months ago Primary insomnia   Primary Care at [facility], Renette Butters, MD   11 months ago Sore throat   Primary Care at [facility], Reather Laurence, PA-C   1 year ago Medicare annual wellness visit, subsequent   Primary Care at [facility], Renette Butters, MD   2 years ago Insomnia   Primary Care at [facility], MD   2 years ago Shingles   Primary Care at [facility], MD      Future Appointments            In 1 month Rutherford Guys, MD Primary Care at [place], [facility]

## 2018-03-18 NOTE — Telephone Encounter (Signed)
Patient called and advised that Dr. Tamala Julian has left the practice and in order to refill her medications she will need to establish with another provider or follow Dr. Tamala Julian. She says she will stay at [facility] and establish with another provider. She says she is due for a physical on 04/05/18. Appointment scheduled for Friday, 04/18/18 at 1000 with Dr. Pamella Pert for [place]. Patient advised medication refills can take up to 3 business days, she verbalized understanding.

## 2018-03-18 NOTE — Telephone Encounter (Signed)
Copied from [place] [id number]. Topic: Quick Communication - Rx Refill/Question >> Mar 18, 2018  3:18 PM Sheppard Coil, [employer] wrote: Medication:  LORazepam (ATIVAN) 0.5 MG tablet ranitidine (ZANTAC) 150 MG tablet  Has the patient contacted their pharmacy? No - Dr. Tamala Julian has left and she wasn't sure what to do (Agent: If no, request that the patient contact the pharmacy for the refill.) (Agent: If yes, when and what did the pharmacy advise?)  Preferred Pharmacy (with phone number or street name): [place], [place] - [address] [phone] (Phone) [phone] (Fax)  Agent: Please be advised that RX refills may take up to 3 business days. We ask that you follow-up with your pharmacy.

## 2018-03-24 NOTE — Telephone Encounter (Signed)
Pt has 3 lorazepam pills

## 2018-03-25 NOTE — Telephone Encounter (Signed)
Patient called back regarding her refill she was not happy that her refill was not yet at the pharmacy. She does have an appointment with Dr Pamella Pert for 04/18/18. Also stated that she has 2 pills left.

## 2018-03-25 NOTE — Telephone Encounter (Signed)
Patient is requesting a refill of the following medications: Requested Prescriptions   Pending Prescriptions Disp Refills  . LORazepam (ATIVAN) 0.5 MG tablet 30 tablet 4    Sig: Take 1 tablet (0.5 mg total) by mouth at bedtime as needed.   Signed Prescriptions Disp Refills  . ranitidine (ZANTAC) 150 MG tablet 30 tablet 0    Sig: Take 1 tablet (150 mg total) by mouth 2 (two) times daily.    Authorizing Provider: Rutherford Guys    Ordering User: Matilde Sprang    Date of patient request: 03/18/2018 Last office visit: 04/17/2017 Date of last refill: 10/29/2017 Last refill amount: 30 RF 4  Follow up time period per chart:

## 2018-03-26 MED ORDER — LORAZEPAM 0.5 MG PO TABS: 0.5000 mg | ORAL_TABLET | Freq: Every evening | ORAL | 0 refills | Status: DC | PRN

## 2018-03-26 NOTE — Telephone Encounter (Signed)
Pt has come to the office and she is upset for she is out of medication after tonight. She is requesting a refill for her Lorazepam 0.5 mg for her insomnia. Pt reports she takes it every night.   Please advise on refill. She does have an appointment coming up with Dr Pamella Pert for 04/18/18. Pt has   Please advise on refill.

## 2018-03-26 NOTE — Telephone Encounter (Signed)
pmp reviewed. Med refilled for 30 days no refills Has appt for CPE on 04/18/18

## 2018-03-27 NOTE — Telephone Encounter (Signed)
Noted. I have attempted to call pt to notify her of this. There was no answer and unable to leave VM on multiple numbers.

## 2018-04-10 ENCOUNTER — Other Ambulatory Visit: Payer: Self-pay | Admitting: Obstetrics and Gynecology

## 2018-04-10 DIAGNOSIS — Z1231 Encounter for screening mammogram for malignant neoplasm of breast: Secondary | ICD-10-CM

## 2018-04-18 ENCOUNTER — Encounter: Payer: Self-pay | Admitting: Family Medicine

## 2018-04-18 DIAGNOSIS — Z23 Encounter for immunization: Secondary | ICD-10-CM | POA: Diagnosis not present

## 2018-05-15 DIAGNOSIS — J309 Allergic rhinitis, unspecified: Secondary | ICD-10-CM | POA: Diagnosis not present

## 2018-05-15 DIAGNOSIS — G4700 Insomnia, unspecified: Secondary | ICD-10-CM | POA: Diagnosis not present

## 2018-05-15 DIAGNOSIS — Z1389 Encounter for screening for other disorder: Secondary | ICD-10-CM | POA: Diagnosis not present

## 2018-05-15 DIAGNOSIS — K219 Gastro-esophageal reflux disease without esophagitis: Secondary | ICD-10-CM | POA: Diagnosis not present

## 2018-05-22 ENCOUNTER — Ambulatory Visit
Admission: RE | Admit: 2018-05-22 | Discharge: 2018-05-22 | Disposition: A | Discharge status: Home / Self Care | Payer: Medicare Other | Source: Ambulatory Visit | Attending: Obstetrics and Gynecology | Admitting: Obstetrics and Gynecology

## 2018-05-22 DIAGNOSIS — Z1231 Encounter for screening mammogram for malignant neoplasm of breast: Secondary | ICD-10-CM | POA: Diagnosis not present

## 2018-11-17 ENCOUNTER — Ambulatory Visit (HOSPITAL_COMMUNITY)
Admission: EM | Admit: 2018-11-17 | Discharge: 2018-11-17 | Disposition: A | Discharge status: Home / Self Care | Payer: Medicare Other | Attending: Family Medicine | Admitting: Family Medicine

## 2018-11-17 ENCOUNTER — Other Ambulatory Visit: Payer: Self-pay

## 2018-11-17 ENCOUNTER — Encounter (HOSPITAL_COMMUNITY): Payer: Self-pay | Admitting: Emergency Medicine

## 2018-11-17 DIAGNOSIS — G4700 Insomnia, unspecified: Secondary | ICD-10-CM

## 2018-11-17 DIAGNOSIS — R21 Rash and other nonspecific skin eruption: Secondary | ICD-10-CM | POA: Diagnosis not present

## 2018-11-17 DIAGNOSIS — Z760 Encounter for issue of repeat prescription: Secondary | ICD-10-CM

## 2018-11-17 DIAGNOSIS — K219 Gastro-esophageal reflux disease without esophagitis: Secondary | ICD-10-CM

## 2018-11-17 DIAGNOSIS — B029 Zoster without complications: Secondary | ICD-10-CM

## 2018-11-17 DIAGNOSIS — J309 Allergic rhinitis, unspecified: Secondary | ICD-10-CM | POA: Diagnosis not present

## 2018-11-17 MED ORDER — TRIAMCINOLONE ACETONIDE 0.1 % EX CREA
1.0000 | TOPICAL_CREAM | Freq: Two times a day (BID) | CUTANEOUS | 0 refills | Status: DC
Start: 2018-11-17 — End: 2021-08-03

## 2018-11-17 MED ORDER — LORATADINE 10 MG PO TABS: 10.0000 mg | ORAL_TABLET | Freq: Every day | ORAL | 0 refills | Status: AC

## 2018-11-17 MED ORDER — VALACYCLOVIR HCL 1 G PO TABS: 1000.0000 mg | ORAL_TABLET | Freq: Three times a day (TID) | ORAL | 0 refills | Status: AC

## 2018-11-17 MED ORDER — LEVOCETIRIZINE DIHYDROCHLORIDE 5 MG PO TABS: 5.0000 mg | ORAL_TABLET | Freq: Every evening | ORAL | 0 refills | Status: AC

## 2018-11-17 MED ORDER — OMEPRAZOLE 20 MG PO CPDR: 20.0000 mg | DELAYED_RELEASE_CAPSULE | Freq: Every day | ORAL | 0 refills | Status: DC

## 2018-11-17 MED ORDER — TRAZODONE HCL 50 MG PO TABS: 50.0000 mg | ORAL_TABLET | Freq: Every day | ORAL | 0 refills | Status: AC

## 2018-11-17 NOTE — Discharge Instructions (Signed)
Begin valtrex three times daily over the next 1-2 weeks for shingles May apply triamcinolone cream twice daily to rash to help with itching  I have refilled xyzal for allergies, trazadone for sleep and prilosec for GERD

## 2018-11-17 NOTE — ED Triage Notes (Signed)
Pt c/o rash on her back since Saturday, states it was painful around her R rib area, states she thinks it might be shingles.

## 2018-11-18 NOTE — ED Provider Notes (Signed)
[facility]    CSN: [id number] Arrival date & time: 11/17/18  4098     History   Chief Complaint Chief Complaint  Patient presents with  . Rash    HPI Brandy Leonard is a 73 y.o. female history of allergic rhinitis, GERD, insomnia, presenting today for evaluation of a rash.  Patient states that on her right rib area she has developed discomfort and soreness.  She is started to develop a rash and is concerned about shingles.  States that she previously has had shingles and feels similar.  She denies any injury.  Denies rash on left side of body or elsewhere other than back.  Symptoms have been going on for approximately 3 days.  She denies any fevers.  Denies chills, body aches.  Denies URI symptoms of cough, congestion or sore throat.  Denies any nausea or vomiting.  Denies chest pain or shortness of breath.  Is also had associated itching.  HPI  Past Medical History:  Diagnosis Date  . Allergy    Benadryl, Flonase  . GERD (gastroesophageal reflux disease)   . Hemorrhoids   . Insomnia   . Lichen sclerosus of female genitalia     Patient Active Problem List   Diagnosis Date Noted  . First degree hemorrhoids 02/08/2015  . Lichen sclerosus of female genitalia 02/08/2015  . Allergic rhinitis due to pollen 02/08/2015  . Insomnia 02/26/2012    History reviewed. No pertinent surgical history.  OB History   No obstetric history on file.      Home Medications    Prior to Admission medications   Medication Sig Start Date End Date Taking? Authorizing Provider  azelastine (ASTELIN) 0.1 % nasal spray Place 2 sprays 2 (two) times daily into both nostrils. Use in each nostril as directed 04/17/17   Wardell Honour, MD  clobetasol cream (TEMOVATE) 1.19 % Apply 1 application topically 2 (two) times daily. 04/04/16   Wardell Honour, MD  hydrocortisone (ANUSOL-HC) 2.5 % rectal cream Place 1 application 2 (two) times daily rectally. 04/17/17   Wardell Honour, MD   hydrocortisone (ANUSOL-HC) 25 MG suppository Place 1 suppository (25 mg total) 2 (two) times daily rectally. 04/17/17   Wardell Honour, MD  hydrocortisone 2.5 % ointment Apply 2 (two) times daily topically. 04/17/17   Wardell Honour, MD  levocetirizine (XYZAL) 5 MG tablet Take 1 tablet (5 mg total) by mouth every evening. 11/17/18   Lil Lepage C, PA-C  loratadine (CLARITIN) 10 MG tablet Take 1 tablet (10 mg total) by mouth daily. 11/17/18   Kyel Purk C, PA-C  omeprazole (PRILOSEC) 20 MG capsule Take 1 capsule (20 mg total) by mouth daily. 11/17/18   Tamicka Shimon C, PA-C  traZODone (DESYREL) 50 MG tablet Take 1 tablet (50 mg total) by mouth at bedtime. 11/17/18   Absalom Aro C, PA-C  triamcinolone cream (KENALOG) 0.1 % Apply 1 application topically 2 (two) times daily. 11/17/18   Michiah Masse C, PA-C  valACYclovir (VALTREX) 1000 MG tablet Take 1 tablet (1,000 mg total) by mouth 3 (three) times daily for 14 days. 11/17/18 12/01/18  Dailyn Kempner, Elesa Hacker, PA-C    Family History Family History  Problem Relation Age of Onset  . Hypertension Mother   . Heart disease Mother   . Thyroid disease Father   . Cancer Father 9       Bladder cancer  . Stroke Father 96       CVA  . Hypertension  Brother   . Stroke Maternal Grandmother   . Hypertension Maternal Grandmother   . Cancer Maternal Grandfather   . Hypertension Sister     Social History Social History   Tobacco Use  . Smoking status: Former Research scientist (life sciences)  . Smokeless tobacco: Never Used  Substance Use Topics  . Alcohol use: Yes    Comment: Occasionally   . Drug use: No     Allergies   Prednisone   Review of Systems Review of Systems  Constitutional: Negative for fatigue and fever.  HENT: Negative for mouth sores.   Eyes: Negative for visual disturbance.  Respiratory: Negative for shortness of breath.   Cardiovascular: Negative for chest pain.  Gastrointestinal: Negative for abdominal pain, nausea and vomiting.   Genitourinary: Negative for genital sores.  Musculoskeletal: Negative for arthralgias and joint swelling.  Skin: Positive for color change and rash. Negative for wound.  Neurological: Negative for dizziness, weakness, light-headedness and headaches.     Physical Exam Triage Vital Signs ED Triage Vitals  Enc Vitals Group     BP 11/17/18 1020 (!) 169/94     Pulse Rate 11/17/18 1020 86     Resp 11/17/18 1020 16     Temp 11/17/18 1020 98.4 F (36.9 C)     Temp src --      SpO2 11/17/18 1020 98 %     Weight --      Height --      Head Circumference --      Peak Flow --      Pain Score 11/17/18 1022 8     Pain Loc --      Pain Edu? --      Excl. in [place]? --    No data found.  Updated Vital Signs BP (!) 169/94   Pulse 86   Temp 98.4 F (36.9 C)   Resp 16   SpO2 98%   Visual Acuity Right Eye Distance:   Left Eye Distance:   Bilateral Distance:    Right Eye Near:   Left Eye Near:    Bilateral Near:     Physical Exam Vitals signs and nursing note reviewed.  Constitutional:      General: She is not in acute distress.    Appearance: She is well-developed.  HENT:     Head: Normocephalic and atraumatic.  Eyes:     Conjunctiva/sclera: Conjunctivae normal.  Neck:     Musculoskeletal: Neck supple.  Cardiovascular:     Rate and Rhythm: Normal rate and regular rhythm.     Heart sounds: No murmur.  Pulmonary:     Effort: Pulmonary effort is normal. No respiratory distress.     Breath sounds: Normal breath sounds.     Comments: Breathing comfortably at rest, CTABL, no wheezing, rales or other adventitious sounds auscultated Abdominal:     Palpations: Abdomen is soft.     Tenderness: There is no abdominal tenderness.  Skin:    General: Skin is warm and dry.     Comments: Right mid thoracic back with area of erythema, no of his vesicles or papules noted, area near right axilla/right breast anteriorly with similar erythema, scabbing, evidence of excoriation  Neurological:      Mental Status: She is alert.      UC Treatments / Results  Labs (all labs ordered are listed, but only abnormal results are displayed) Labs Reviewed - No data to display  EKG None  Radiology No results found.  Procedures Procedures (including critical  care time)  Medications Ordered in UC Medications - No data to display  Initial Impression / Assessment and Plan / UC Course  I have reviewed the triage vital signs and the nursing notes.  Pertinent labs & imaging results that were available during my care of the patient were reviewed by me and considered in my medical decision making (see chart for details).    Rash suggestive of seeing shingles given only located on right side and associated itching, burning and soreness.  Will initiate on Valtrex 3 times daily x1 to 2 weeks.  May apply topical triamcinolone to help with itching as well as local inflammation.  Provided medication refill for her Xyzal, trazodone and Prilosec as she is working on getting in with PCP.  Further refills recommended to obtain from PCP.  Discussed strict return precautions. Patient verbalized understanding and is agreeable with plan.  Final Clinical Impressions(s) / UC Diagnoses   Final diagnoses:  Rash and nonspecific skin eruption  Herpes zoster without complication  Medication refill     Discharge Instructions     Begin valtrex three times daily over the next 1-2 weeks for shingles May apply triamcinolone cream twice daily to rash to help with itching  I have refilled xyzal for allergies, trazadone for sleep and prilosec for GERD   ED Prescriptions    Medication Sig Dispense Auth. Provider   valACYclovir (VALTREX) 1000 MG tablet Take 1 tablet (1,000 mg total) by mouth 3 (three) times daily for 14 days. 42 tablet Earl Losee C, PA-C   triamcinolone cream (KENALOG) 0.1 % Apply 1 application topically 2 (two) times daily. 30 g Oriah Leinweber C, PA-C   loratadine (CLARITIN) 10  MG tablet Take 1 tablet (10 mg total) by mouth daily. 30 tablet Dinorah Masullo C, PA-C   levocetirizine (XYZAL) 5 MG tablet Take 1 tablet (5 mg total) by mouth every evening. 60 tablet Kynslee Baham C, PA-C   omeprazole (PRILOSEC) 20 MG capsule Take 1 capsule (20 mg total) by mouth daily. 60 capsule Nayel Purdy C, PA-C   traZODone (DESYREL) 50 MG tablet Take 1 tablet (50 mg total) by mouth at bedtime. 30 tablet Enyah Moman, [place] C, PA-C     Controlled Substance Prescriptions [place] Controlled Substance Registry consulted? Not Applicable   Janith Lima, [place] 11/18/18 1606

## 2018-11-25 DIAGNOSIS — J309 Allergic rhinitis, unspecified: Secondary | ICD-10-CM | POA: Diagnosis not present

## 2018-11-25 DIAGNOSIS — G4700 Insomnia, unspecified: Secondary | ICD-10-CM | POA: Diagnosis not present

## 2018-11-25 DIAGNOSIS — K219 Gastro-esophageal reflux disease without esophagitis: Secondary | ICD-10-CM | POA: Diagnosis not present

## 2018-11-25 DIAGNOSIS — B029 Zoster without complications: Secondary | ICD-10-CM | POA: Diagnosis not present

## 2018-12-15 DIAGNOSIS — J309 Allergic rhinitis, unspecified: Secondary | ICD-10-CM | POA: Diagnosis not present

## 2018-12-15 DIAGNOSIS — K219 Gastro-esophageal reflux disease without esophagitis: Secondary | ICD-10-CM | POA: Diagnosis not present

## 2018-12-15 DIAGNOSIS — Z0000 Encounter for general adult medical examination without abnormal findings: Secondary | ICD-10-CM | POA: Diagnosis not present

## 2018-12-15 DIAGNOSIS — G4700 Insomnia, unspecified: Secondary | ICD-10-CM | POA: Diagnosis not present

## 2018-12-15 DIAGNOSIS — Z136 Encounter for screening for cardiovascular disorders: Secondary | ICD-10-CM | POA: Diagnosis not present

## 2018-12-15 DIAGNOSIS — B0229 Other postherpetic nervous system involvement: Secondary | ICD-10-CM | POA: Diagnosis not present

## 2019-02-12 DIAGNOSIS — Z23 Encounter for immunization: Secondary | ICD-10-CM | POA: Diagnosis not present

## 2019-04-07 DIAGNOSIS — H2513 Age-related nuclear cataract, bilateral: Secondary | ICD-10-CM | POA: Diagnosis not present

## 2019-04-07 DIAGNOSIS — H25013 Cortical age-related cataract, bilateral: Secondary | ICD-10-CM | POA: Diagnosis not present

## 2019-04-07 DIAGNOSIS — H353131 Nonexudative age-related macular degeneration, bilateral, early dry stage: Secondary | ICD-10-CM | POA: Diagnosis not present

## 2019-04-07 DIAGNOSIS — H04123 Dry eye syndrome of bilateral lacrimal glands: Secondary | ICD-10-CM | POA: Diagnosis not present

## 2019-06-18 ENCOUNTER — Ambulatory Visit (INDEPENDENT_AMBULATORY_CARE_PROVIDER_SITE_OTHER): Payer: Medicare Other

## 2019-06-18 ENCOUNTER — Ambulatory Visit (INDEPENDENT_AMBULATORY_CARE_PROVIDER_SITE_OTHER): Payer: Medicare Other | Admitting: Orthopaedic Surgery

## 2019-06-18 ENCOUNTER — Other Ambulatory Visit: Payer: Self-pay

## 2019-06-18 DIAGNOSIS — M545 Low back pain, unspecified: Secondary | ICD-10-CM

## 2019-06-18 DIAGNOSIS — M542 Cervicalgia: Secondary | ICD-10-CM

## 2019-06-18 DIAGNOSIS — M25512 Pain in left shoulder: Secondary | ICD-10-CM

## 2019-06-18 MED ORDER — DICLOFENAC SODIUM 75 MG PO TBEC: 75.0000 mg | DELAYED_RELEASE_TABLET | Freq: Two times a day (BID) | ORAL | 0 refills | Status: DC | PRN

## 2019-06-18 MED ORDER — TRAMADOL HCL 50 MG PO TABS: 50.0000 mg | ORAL_TABLET | Freq: Four times a day (QID) | ORAL | 0 refills | Status: DC | PRN

## 2019-06-18 MED ORDER — METHOCARBAMOL 500 MG PO TABS: 500.0000 mg | ORAL_TABLET | Freq: Four times a day (QID) | ORAL | 1 refills | Status: DC | PRN
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
## 2019-06-26 DIAGNOSIS — Z20822 Contact with and (suspected) exposure to covid-19: Secondary | ICD-10-CM | POA: Diagnosis not present

## 2019-07-03 ENCOUNTER — Ambulatory Visit: Payer: Medicare Other | Attending: Internal Medicine

## 2019-07-03 DIAGNOSIS — Z23 Encounter for immunization: Secondary | ICD-10-CM | POA: Insufficient documentation

## 2019-07-03 NOTE — Progress Notes (Signed)
   Covid-19 Vaccination Clinic  Name:  Brandy Leonard    MRN: [id number] DOB: 1945/11/23  07/03/2019  [name] was observed post Covid-19 immunization for 15 minutes without incidence. She was provided with Vaccine Information Sheet and instruction to access the V-Safe system.   [name] was instructed to call 911 with any severe reactions post vaccine: Marland Kitchen Difficulty breathing  . Swelling of your face and throat  . A fast heartbeat  . A bad rash all over your body  . Dizziness and weakness    Immunizations Administered    Name Date Dose VIS Date Route   Pfizer COVID-19 Vaccine 07/03/2019  9:49 AM 0.3 mL 05/22/2019 Intramuscular   Manufacturer: [place]   Lot: [id number]   [place]: [id number]

## 2019-07-20 ENCOUNTER — Other Ambulatory Visit: Payer: Self-pay | Admitting: Orthopaedic Surgery

## 2019-07-22 ENCOUNTER — Other Ambulatory Visit: Payer: Self-pay | Admitting: Orthopaedic Surgery

## 2019-07-23 NOTE — Telephone Encounter (Signed)
Please advise 

## 2019-07-24 ENCOUNTER — Ambulatory Visit: Payer: Medicare Other

## 2019-07-29 ENCOUNTER — Other Ambulatory Visit: Payer: Self-pay | Admitting: Obstetrics and Gynecology

## 2019-07-29 DIAGNOSIS — Z1231 Encounter for screening mammogram for malignant neoplasm of breast: Secondary | ICD-10-CM

## 2019-08-01 ENCOUNTER — Ambulatory Visit: Payer: Medicare Other | Attending: Internal Medicine

## 2019-08-01 DIAGNOSIS — Z23 Encounter for immunization: Secondary | ICD-10-CM

## 2019-08-01 NOTE — Progress Notes (Signed)
   Covid-19 Vaccination Clinic  Name:  Brandy Leonard    MRN: [id number] DOB: May 21, 1946  08/01/2019  [name] was observed post Covid-19 immunization for 15 minutes without incidence. She was provided with Vaccine Information Sheet and instruction to access the V-Safe system.   [name] was instructed to call 911 with any severe reactions post vaccine: Marland Kitchen Difficulty breathing  . Swelling of your face and throat  . A fast heartbeat  . A bad rash all over your body  . Dizziness and weakness    Immunizations Administered    Name Date Dose VIS Date Route   Pfizer COVID-19 Vaccine 08/01/2019  9:18 AM 0.3 mL 05/22/2019 Intramuscular   Manufacturer: [place]   Lot: [id number]   [place]: [id number]

## 2019-08-03 ENCOUNTER — Ambulatory Visit: Payer: Medicare Other

## 2019-08-06 DIAGNOSIS — S138XXA Sprain of joints and ligaments of other parts of neck, initial encounter: Secondary | ICD-10-CM | POA: Diagnosis not present

## 2019-08-06 DIAGNOSIS — S39012A Strain of muscle, fascia and tendon of lower back, initial encounter: Secondary | ICD-10-CM | POA: Diagnosis not present

## 2019-08-06 DIAGNOSIS — M9902 Segmental and somatic dysfunction of thoracic region: Secondary | ICD-10-CM | POA: Diagnosis not present

## 2019-08-06 DIAGNOSIS — M9903 Segmental and somatic dysfunction of lumbar region: Secondary | ICD-10-CM | POA: Diagnosis not present

## 2019-08-06 DIAGNOSIS — M9905 Segmental and somatic dysfunction of pelvic region: Secondary | ICD-10-CM | POA: Diagnosis not present

## 2019-08-06 DIAGNOSIS — S29012A Strain of muscle and tendon of back wall of thorax, initial encounter: Secondary | ICD-10-CM | POA: Diagnosis not present

## 2019-08-06 DIAGNOSIS — M9901 Segmental and somatic dysfunction of cervical region: Secondary | ICD-10-CM | POA: Diagnosis not present

## 2019-08-06 DIAGNOSIS — S338XXA Sprain of other parts of lumbar spine and pelvis, initial encounter: Secondary | ICD-10-CM | POA: Diagnosis not present

## 2019-08-11 DIAGNOSIS — M9903 Segmental and somatic dysfunction of lumbar region: Secondary | ICD-10-CM | POA: Diagnosis not present

## 2019-08-11 DIAGNOSIS — S338XXA Sprain of other parts of lumbar spine and pelvis, initial encounter: Secondary | ICD-10-CM | POA: Diagnosis not present

## 2019-08-11 DIAGNOSIS — M9902 Segmental and somatic dysfunction of thoracic region: Secondary | ICD-10-CM | POA: Diagnosis not present

## 2019-08-11 DIAGNOSIS — S39012A Strain of muscle, fascia and tendon of lower back, initial encounter: Secondary | ICD-10-CM | POA: Diagnosis not present

## 2019-08-11 DIAGNOSIS — S138XXA Sprain of joints and ligaments of other parts of neck, initial encounter: Secondary | ICD-10-CM | POA: Diagnosis not present

## 2019-08-11 DIAGNOSIS — M9901 Segmental and somatic dysfunction of cervical region: Secondary | ICD-10-CM | POA: Diagnosis not present

## 2019-08-11 DIAGNOSIS — S29012A Strain of muscle and tendon of back wall of thorax, initial encounter: Secondary | ICD-10-CM | POA: Diagnosis not present

## 2019-08-11 DIAGNOSIS — M9905 Segmental and somatic dysfunction of pelvic region: Secondary | ICD-10-CM | POA: Diagnosis not present

## 2019-08-13 DIAGNOSIS — S39012A Strain of muscle, fascia and tendon of lower back, initial encounter: Secondary | ICD-10-CM | POA: Diagnosis not present

## 2019-08-13 DIAGNOSIS — S338XXA Sprain of other parts of lumbar spine and pelvis, initial encounter: Secondary | ICD-10-CM | POA: Diagnosis not present

## 2019-08-13 DIAGNOSIS — S138XXA Sprain of joints and ligaments of other parts of neck, initial encounter: Secondary | ICD-10-CM | POA: Diagnosis not present

## 2019-08-13 DIAGNOSIS — M9902 Segmental and somatic dysfunction of thoracic region: Secondary | ICD-10-CM | POA: Diagnosis not present

## 2019-08-13 DIAGNOSIS — M9901 Segmental and somatic dysfunction of cervical region: Secondary | ICD-10-CM | POA: Diagnosis not present

## 2019-08-13 DIAGNOSIS — M9905 Segmental and somatic dysfunction of pelvic region: Secondary | ICD-10-CM | POA: Diagnosis not present

## 2019-08-13 DIAGNOSIS — S29012A Strain of muscle and tendon of back wall of thorax, initial encounter: Secondary | ICD-10-CM | POA: Diagnosis not present

## 2019-08-13 DIAGNOSIS — M9903 Segmental and somatic dysfunction of lumbar region: Secondary | ICD-10-CM | POA: Diagnosis not present

## 2019-08-18 DIAGNOSIS — S138XXA Sprain of joints and ligaments of other parts of neck, initial encounter: Secondary | ICD-10-CM | POA: Diagnosis not present

## 2019-08-18 DIAGNOSIS — S39012A Strain of muscle, fascia and tendon of lower back, initial encounter: Secondary | ICD-10-CM | POA: Diagnosis not present

## 2019-08-18 DIAGNOSIS — M9905 Segmental and somatic dysfunction of pelvic region: Secondary | ICD-10-CM | POA: Diagnosis not present

## 2019-08-18 DIAGNOSIS — S338XXA Sprain of other parts of lumbar spine and pelvis, initial encounter: Secondary | ICD-10-CM | POA: Diagnosis not present

## 2019-08-18 DIAGNOSIS — M9902 Segmental and somatic dysfunction of thoracic region: Secondary | ICD-10-CM | POA: Diagnosis not present

## 2019-08-18 DIAGNOSIS — M9901 Segmental and somatic dysfunction of cervical region: Secondary | ICD-10-CM | POA: Diagnosis not present

## 2019-08-18 DIAGNOSIS — S29012A Strain of muscle and tendon of back wall of thorax, initial encounter: Secondary | ICD-10-CM | POA: Diagnosis not present

## 2019-08-18 DIAGNOSIS — M9903 Segmental and somatic dysfunction of lumbar region: Secondary | ICD-10-CM | POA: Diagnosis not present

## 2019-08-21 ENCOUNTER — Telehealth: Payer: Self-pay | Admitting: Orthopaedic Surgery

## 2019-08-21 NOTE — Telephone Encounter (Signed)
Received vm from [facility] w/ [employer]. She stated she had received billing but was in need of the records. IC,lmvm. I advised she need to fax a request for the records as there is no correspondence from [employer] in patients chart,dont know who sent the billing, but it wasn't this office. I left our fax number for her to fax request with the auth.

## 2019-08-25 DIAGNOSIS — M9901 Segmental and somatic dysfunction of cervical region: Secondary | ICD-10-CM | POA: Diagnosis not present

## 2019-08-25 DIAGNOSIS — S338XXA Sprain of other parts of lumbar spine and pelvis, initial encounter: Secondary | ICD-10-CM | POA: Diagnosis not present

## 2019-08-25 DIAGNOSIS — S138XXA Sprain of joints and ligaments of other parts of neck, initial encounter: Secondary | ICD-10-CM | POA: Diagnosis not present

## 2019-08-25 DIAGNOSIS — M9902 Segmental and somatic dysfunction of thoracic region: Secondary | ICD-10-CM | POA: Diagnosis not present

## 2019-08-25 DIAGNOSIS — S39012A Strain of muscle, fascia and tendon of lower back, initial encounter: Secondary | ICD-10-CM | POA: Diagnosis not present

## 2019-08-25 DIAGNOSIS — S29012A Strain of muscle and tendon of back wall of thorax, initial encounter: Secondary | ICD-10-CM | POA: Diagnosis not present

## 2019-08-25 DIAGNOSIS — M9903 Segmental and somatic dysfunction of lumbar region: Secondary | ICD-10-CM | POA: Diagnosis not present

## 2019-08-25 DIAGNOSIS — M9905 Segmental and somatic dysfunction of pelvic region: Secondary | ICD-10-CM | POA: Diagnosis not present

## 2019-08-27 DIAGNOSIS — S29012A Strain of muscle and tendon of back wall of thorax, initial encounter: Secondary | ICD-10-CM | POA: Diagnosis not present

## 2019-08-27 DIAGNOSIS — M9905 Segmental and somatic dysfunction of pelvic region: Secondary | ICD-10-CM | POA: Diagnosis not present

## 2019-08-27 DIAGNOSIS — M9902 Segmental and somatic dysfunction of thoracic region: Secondary | ICD-10-CM | POA: Diagnosis not present

## 2019-08-27 DIAGNOSIS — S138XXA Sprain of joints and ligaments of other parts of neck, initial encounter: Secondary | ICD-10-CM | POA: Diagnosis not present

## 2019-08-27 DIAGNOSIS — M9903 Segmental and somatic dysfunction of lumbar region: Secondary | ICD-10-CM | POA: Diagnosis not present

## 2019-08-27 DIAGNOSIS — S338XXA Sprain of other parts of lumbar spine and pelvis, initial encounter: Secondary | ICD-10-CM | POA: Diagnosis not present

## 2019-08-27 DIAGNOSIS — M9901 Segmental and somatic dysfunction of cervical region: Secondary | ICD-10-CM | POA: Diagnosis not present

## 2019-08-27 DIAGNOSIS — S39012A Strain of muscle, fascia and tendon of lower back, initial encounter: Secondary | ICD-10-CM | POA: Diagnosis not present

## 2019-08-28 ENCOUNTER — Telehealth: Payer: Self-pay | Admitting: Orthopaedic Surgery

## 2019-08-28 NOTE — Telephone Encounter (Signed)
Patient would like CD of xrays 06/12/19 to present. Call when ready [phone] (has signed release)

## 2019-08-28 NOTE — Telephone Encounter (Signed)
Advised patient CD is ready for pickup

## 2019-08-31 ENCOUNTER — Telehealth: Payer: Self-pay | Admitting: Orthopaedic Surgery

## 2019-08-31 NOTE — Telephone Encounter (Signed)
Received another msg from [place] w/ [employer] [phone] wanting records as she states she has bills. I called her on 3/12,lmvm and again today advising we do not have correspondese from [place] not know who sent the billing, but for the records, she need to send request w/auth. Left both our fax#

## 2019-09-01 ENCOUNTER — Telehealth: Payer: Self-pay | Admitting: Radiology

## 2019-09-01 DIAGNOSIS — M9901 Segmental and somatic dysfunction of cervical region: Secondary | ICD-10-CM | POA: Diagnosis not present

## 2019-09-01 DIAGNOSIS — S29012A Strain of muscle and tendon of back wall of thorax, initial encounter: Secondary | ICD-10-CM | POA: Diagnosis not present

## 2019-09-01 DIAGNOSIS — S138XXA Sprain of joints and ligaments of other parts of neck, initial encounter: Secondary | ICD-10-CM | POA: Diagnosis not present

## 2019-09-01 DIAGNOSIS — S338XXA Sprain of other parts of lumbar spine and pelvis, initial encounter: Secondary | ICD-10-CM | POA: Diagnosis not present

## 2019-09-01 DIAGNOSIS — M9903 Segmental and somatic dysfunction of lumbar region: Secondary | ICD-10-CM | POA: Diagnosis not present

## 2019-09-01 DIAGNOSIS — M9905 Segmental and somatic dysfunction of pelvic region: Secondary | ICD-10-CM | POA: Diagnosis not present

## 2019-09-01 DIAGNOSIS — S39012A Strain of muscle, fascia and tendon of lower back, initial encounter: Secondary | ICD-10-CM | POA: Diagnosis not present

## 2019-09-01 DIAGNOSIS — M9902 Segmental and somatic dysfunction of thoracic region: Secondary | ICD-10-CM | POA: Diagnosis not present

## 2019-09-01 NOTE — Telephone Encounter (Signed)
Dr. Maudie Mercury from [facility] called states that on one xrays ( Ap Cervical) shows some calcification on the left side of the neck, she is wondering if you can order a U/S of her caratoid artery. 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 Please Advise

## 2019-09-02 ENCOUNTER — Other Ambulatory Visit: Payer: Self-pay

## 2019-09-02 DIAGNOSIS — M542 Cervicalgia: Secondary | ICD-10-CM

## 2019-09-02 NOTE — Telephone Encounter (Signed)
Sent order for this

## 2019-09-02 NOTE — Telephone Encounter (Signed)
I have never ordered 1 of those before but I assume that is reasonable to send her for a Doppler ultrasound of her carotid arteries to assess for blockage.

## 2019-09-03 DIAGNOSIS — S29012A Strain of muscle and tendon of back wall of thorax, initial encounter: Secondary | ICD-10-CM | POA: Diagnosis not present

## 2019-09-03 DIAGNOSIS — M9903 Segmental and somatic dysfunction of lumbar region: Secondary | ICD-10-CM | POA: Diagnosis not present

## 2019-09-03 DIAGNOSIS — S338XXA Sprain of other parts of lumbar spine and pelvis, initial encounter: Secondary | ICD-10-CM | POA: Diagnosis not present

## 2019-09-03 DIAGNOSIS — S39012A Strain of muscle, fascia and tendon of lower back, initial encounter: Secondary | ICD-10-CM | POA: Diagnosis not present

## 2019-09-03 DIAGNOSIS — M9901 Segmental and somatic dysfunction of cervical region: Secondary | ICD-10-CM | POA: Diagnosis not present

## 2019-09-03 DIAGNOSIS — M9905 Segmental and somatic dysfunction of pelvic region: Secondary | ICD-10-CM | POA: Diagnosis not present

## 2019-09-03 DIAGNOSIS — M9902 Segmental and somatic dysfunction of thoracic region: Secondary | ICD-10-CM | POA: Diagnosis not present

## 2019-09-03 DIAGNOSIS — S138XXA Sprain of joints and ligaments of other parts of neck, initial encounter: Secondary | ICD-10-CM | POA: Diagnosis not present

## 2019-09-04 ENCOUNTER — Ambulatory Visit
Admission: RE | Admit: 2019-09-04 | Discharge: 2019-09-04 | Disposition: A | Discharge status: Home / Self Care | Payer: Medicare Other | Source: Ambulatory Visit | Attending: Orthopaedic Surgery | Admitting: Orthopaedic Surgery

## 2019-09-04 ENCOUNTER — Other Ambulatory Visit: Payer: Self-pay

## 2019-09-04 ENCOUNTER — Ambulatory Visit
Admission: RE | Admit: 2019-09-04 | Discharge: 2019-09-04 | Disposition: A | Discharge status: Home / Self Care | Payer: Medicare Other | Source: Ambulatory Visit | Attending: Obstetrics and Gynecology | Admitting: Obstetrics and Gynecology

## 2019-09-04 DIAGNOSIS — I6522 Occlusion and stenosis of left carotid artery: Secondary | ICD-10-CM | POA: Diagnosis not present

## 2019-09-04 DIAGNOSIS — M542 Cervicalgia: Secondary | ICD-10-CM

## 2019-09-04 DIAGNOSIS — Z1231 Encounter for screening mammogram for malignant neoplasm of breast: Secondary | ICD-10-CM

## 2019-09-05 ENCOUNTER — Other Ambulatory Visit: Payer: Self-pay | Admitting: Orthopaedic Surgery

## 2019-09-07 NOTE — Telephone Encounter (Signed)
Dr Ninfa Linden and Artis Delay are out of office this week. Can you advise?

## 2019-09-14 ENCOUNTER — Telehealth: Payer: Self-pay | Admitting: Orthopaedic Surgery

## 2019-09-14 NOTE — Telephone Encounter (Signed)
Received vm from Brandy Leonard w/ [employer] checking if we received request for records. IC,lmvm advised we have not received her request. I left our fax numbers for her to re fax.

## 2019-09-16 DIAGNOSIS — S338XXA Sprain of other parts of lumbar spine and pelvis, initial encounter: Secondary | ICD-10-CM | POA: Diagnosis not present

## 2019-09-16 DIAGNOSIS — S138XXA Sprain of joints and ligaments of other parts of neck, initial encounter: Secondary | ICD-10-CM | POA: Diagnosis not present

## 2019-09-16 DIAGNOSIS — M9901 Segmental and somatic dysfunction of cervical region: Secondary | ICD-10-CM | POA: Diagnosis not present

## 2019-09-16 DIAGNOSIS — M9905 Segmental and somatic dysfunction of pelvic region: Secondary | ICD-10-CM | POA: Diagnosis not present

## 2019-09-16 DIAGNOSIS — M9903 Segmental and somatic dysfunction of lumbar region: Secondary | ICD-10-CM | POA: Diagnosis not present

## 2019-09-16 DIAGNOSIS — S29012A Strain of muscle and tendon of back wall of thorax, initial encounter: Secondary | ICD-10-CM | POA: Diagnosis not present

## 2019-09-16 DIAGNOSIS — M9902 Segmental and somatic dysfunction of thoracic region: Secondary | ICD-10-CM | POA: Diagnosis not present

## 2019-09-16 DIAGNOSIS — S39012A Strain of muscle, fascia and tendon of lower back, initial encounter: Secondary | ICD-10-CM | POA: Diagnosis not present

## 2019-09-23 DIAGNOSIS — M9902 Segmental and somatic dysfunction of thoracic region: Secondary | ICD-10-CM | POA: Diagnosis not present

## 2019-09-23 DIAGNOSIS — M9901 Segmental and somatic dysfunction of cervical region: Secondary | ICD-10-CM | POA: Diagnosis not present

## 2019-09-23 DIAGNOSIS — S138XXA Sprain of joints and ligaments of other parts of neck, initial encounter: Secondary | ICD-10-CM | POA: Diagnosis not present

## 2019-09-23 DIAGNOSIS — M9905 Segmental and somatic dysfunction of pelvic region: Secondary | ICD-10-CM | POA: Diagnosis not present

## 2019-09-23 DIAGNOSIS — M9903 Segmental and somatic dysfunction of lumbar region: Secondary | ICD-10-CM | POA: Diagnosis not present

## 2019-09-23 DIAGNOSIS — S29012A Strain of muscle and tendon of back wall of thorax, initial encounter: Secondary | ICD-10-CM | POA: Diagnosis not present

## 2019-09-23 DIAGNOSIS — S338XXA Sprain of other parts of lumbar spine and pelvis, initial encounter: Secondary | ICD-10-CM | POA: Diagnosis not present

## 2019-09-23 DIAGNOSIS — S39012A Strain of muscle, fascia and tendon of lower back, initial encounter: Secondary | ICD-10-CM | POA: Diagnosis not present

## 2019-09-25 ENCOUNTER — Other Ambulatory Visit: Payer: Self-pay

## 2019-09-25 ENCOUNTER — Encounter (INDEPENDENT_AMBULATORY_CARE_PROVIDER_SITE_OTHER): Payer: Self-pay | Admitting: Otolaryngology

## 2019-09-25 ENCOUNTER — Ambulatory Visit (INDEPENDENT_AMBULATORY_CARE_PROVIDER_SITE_OTHER): Payer: Medicare Other | Admitting: Otolaryngology

## 2019-09-25 VITALS — Temp 97.9°F

## 2019-09-25 DIAGNOSIS — R040 Epistaxis: Secondary | ICD-10-CM

## 2019-09-25 NOTE — Progress Notes (Signed)
HPI: Brandy Leonard is a 74 y.o. female who returns today for evaluation of recent onset of right-sided epistaxis.  This initially began couple weeks ago.  It occurs spontaneously once while at a meal and more recently while in the bath she developed bleeding from the right nostril that was able to be stopped with pressure.  Last nosebleed was 3 days ago.  She has no trouble breathing through the nose but does hav allergies and takes Xyzal..  Past Medical History:  Diagnosis Date  . Allergy    Benadryl, Flonase  . GERD (gastroesophageal reflux disease)   . Hemorrhoids   . Insomnia   . Lichen sclerosus of female genitalia    No past surgical history on file. Social History   Socioeconomic History  . Marital status: Divorced    Spouse name: Not on file  . Number of children: Not on file  . Years of education: Not on file  . Highest education level: Not on file  Occupational History  . Not on file  Tobacco Use  . Smoking status: Former Smoker    Packs/day: 0.50    Years: 20.00    Pack years: 10.00    Start date: 75    Quit date: 2000    Years since quitting: 21.3  . Smokeless tobacco: Never Used  Substance and Sexual Activity  . Alcohol use: Yes    Comment: Occasionally   . Drug use: No  . Sexual activity: Never  Other Topics Concern  . 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        Tobacco:  Quit smoking 1999.  Social smoker.       Alcohol:  Wine one glass per day.       Exercise:  Sporadic exercise.  Treadmill      Advanced Directive: +living will; HCPOA: brother Duffy Rhody). FULL CODE no prolonged measures.        Seatbelt:  100%;       ADLs: independent with ADLs. Drives.   Social Determinants of Health   Financial Resource Strain:   . Difficulty of Paying Living Expenses:   Food Insecurity:   .  Worried About Charity fundraiser in the Last Year:   . Arboriculturist in the Last Year:   Transportation Needs:   . Film/video editor (Medical):   Marland Kitchen Lack of Transportation (Non-Medical):   Physical Activity:   . Days of Exercise per Week:   . Minutes of Exercise per Session:   Stress:   . Feeling of Stress :   Social Connections:   . Frequency of Communication with Friends and Family:   . Frequency of Social Gatherings with Friends and Family:   . Attends Religious Services:   . Active Member of Clubs or Organizations:   . Attends Archivist Meetings:   Marland Kitchen Marital Status:    Family History  Problem Relation Age of Onset  . Hypertension Mother   . Heart disease Mother   . Thyroid disease Father   . Cancer Father 46       Bladder cancer  . Stroke Father 31       CVA  . Hypertension Brother   . Stroke Maternal Grandmother   .  Hypertension Maternal Grandmother   . Cancer Maternal Grandfather   . Hypertension Sister    Allergies  Allergen Reactions  . Prednisone Swelling    Of face   Prior to Admission medications   Medication Sig Start Date End Date Taking? Authorizing Provider  azelastine (ASTELIN) 0.1 % nasal spray Place 2 sprays 2 (two) times daily into both nostrils. Use in each nostril as directed 04/17/17   Wardell Honour, MD  clobetasol cream (TEMOVATE) [id number] % Apply 1 application topically 2 (two) times daily. 04/04/16   Wardell Honour, MD  diclofenac (VOLTAREN) 75 MG EC tablet TAKE 1 TABLET BY MOUTH TWICE A DAY BETWEEN MEALS AS NEEDED 09/07/19   Magnant, Gerrianne Scale, PA-C  hydrocortisone (ANUSOL-HC) 2.5 % rectal cream Place 1 application 2 (two) times daily rectally. 04/17/17   Wardell Honour, MD  hydrocortisone (ANUSOL-HC) 25 MG suppository Place 1 suppository (25 mg total) 2 (two) times daily rectally. 04/17/17   Wardell Honour, MD  hydrocortisone 2.5 % ointment Apply 2 (two) times daily topically. 04/17/17   Wardell Honour, MD  levocetirizine (XYZAL)  5 MG tablet Take 1 tablet (5 mg total) by mouth every evening. 11/17/18   Wieters, Hallie C, PA-C  loratadine (CLARITIN) 10 MG tablet Take 1 tablet (10 mg total) by mouth daily. 11/17/18   Wieters, Hallie C, PA-C  methocarbamol (ROBAXIN) 500 MG tablet TAKE 1 TABLET (500 MG TOTAL) BY MOUTH EVERY 6 (SIX) HOURS AS NEEDED FOR MUSCLE SPASMS. 07/21/19   Mcarthur Rossetti, MD  omeprazole (PRILOSEC) 20 MG capsule Take 1 capsule (20 mg total) by mouth daily. 11/17/18   Wieters, Hallie C, PA-C  traMADol (ULTRAM) 50 MG tablet TAKE 1 TO 2 TABLETS BY MOUTH EVERY 6 HOURS AS NEEDED 07/23/19   Mcarthur Rossetti, MD  traZODone (DESYREL) 50 MG tablet Take 1 tablet (50 mg total) by mouth at bedtime. 11/17/18   Wieters, Hallie C, PA-C  triamcinolone cream (KENALOG) 0.1 % Apply 1 application topically 2 (two) times daily. 11/17/18   Wieters, Hallie C, PA-C     Positive ROS: Otherwise negative  All other systems have been reviewed and were otherwise negative with the exception of those mentioned in the HPI and as above.  Physical Exam: Constitutional: Alert, well-appearing, no acute distress Ears: External ears without lesions or tenderness. Ear canals are clear bilaterally with intact, clear TMs.  Nasal: External nose without lesions. Septum midline.  Nasal passages are clear bilaterally..  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  This is slightly erythematous but is not actively bleeding today.  Briefly discussed the option of cauterizing this versus use of moisturizer such as nasal gel.  She preferred not to have cauterization performed presently.  She does have mild rhinitis consistent with allergies but no signs of infection. Oral: Lips and gums without lesions. Tongue and palate mucosa without lesions. Posterior oropharynx clear. Neck: No palpable adenopathy or masses Respiratory: Breathing comfortably  Skin: No facial/neck  lesions or rash noted.  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  Plan: Reviewed with her concerning use of nasal gel.  I discussed with her concerning using packing with a cotton ball and Afrin or cold water to help stop bleeding if it is bad.  If she continues to have recurrent nosebleeds she will follow-up for cauterization if needed.   Radene Journey, MD

## 2019-09-29 ENCOUNTER — Other Ambulatory Visit: Payer: Self-pay | Admitting: Surgical

## 2019-09-29 NOTE — Telephone Encounter (Signed)
Ok to rf? 

## 2019-09-30 ENCOUNTER — Other Ambulatory Visit: Payer: Self-pay | Admitting: Surgical

## 2019-09-30 DIAGNOSIS — S138XXA Sprain of joints and ligaments of other parts of neck, initial encounter: Secondary | ICD-10-CM | POA: Diagnosis not present

## 2019-09-30 DIAGNOSIS — M9902 Segmental and somatic dysfunction of thoracic region: Secondary | ICD-10-CM | POA: Diagnosis not present

## 2019-09-30 DIAGNOSIS — M9905 Segmental and somatic dysfunction of pelvic region: Secondary | ICD-10-CM | POA: Diagnosis not present

## 2019-09-30 DIAGNOSIS — M9903 Segmental and somatic dysfunction of lumbar region: Secondary | ICD-10-CM | POA: Diagnosis not present

## 2019-09-30 DIAGNOSIS — S39012A Strain of muscle, fascia and tendon of lower back, initial encounter: Secondary | ICD-10-CM | POA: Diagnosis not present

## 2019-09-30 DIAGNOSIS — S29012A Strain of muscle and tendon of back wall of thorax, initial encounter: Secondary | ICD-10-CM | POA: Diagnosis not present

## 2019-09-30 DIAGNOSIS — M9901 Segmental and somatic dysfunction of cervical region: Secondary | ICD-10-CM | POA: Diagnosis not present

## 2019-09-30 DIAGNOSIS — S338XXA Sprain of other parts of lumbar spine and pelvis, initial encounter: Secondary | ICD-10-CM | POA: Diagnosis not present

## 2019-10-06 NOTE — Telephone Encounter (Signed)
CB pt 

## 2019-10-06 NOTE — Telephone Encounter (Signed)
[facility] patient 

## 2019-10-07 DIAGNOSIS — S29012A Strain of muscle and tendon of back wall of thorax, initial encounter: Secondary | ICD-10-CM | POA: Diagnosis not present

## 2019-10-07 DIAGNOSIS — M9905 Segmental and somatic dysfunction of pelvic region: Secondary | ICD-10-CM | POA: Diagnosis not present

## 2019-10-07 DIAGNOSIS — S138XXA Sprain of joints and ligaments of other parts of neck, initial encounter: Secondary | ICD-10-CM | POA: Diagnosis not present

## 2019-10-07 DIAGNOSIS — M9901 Segmental and somatic dysfunction of cervical region: Secondary | ICD-10-CM | POA: Diagnosis not present

## 2019-10-07 DIAGNOSIS — S39012A Strain of muscle, fascia and tendon of lower back, initial encounter: Secondary | ICD-10-CM | POA: Diagnosis not present

## 2019-10-07 DIAGNOSIS — M9902 Segmental and somatic dysfunction of thoracic region: Secondary | ICD-10-CM | POA: Diagnosis not present

## 2019-10-07 DIAGNOSIS — M9903 Segmental and somatic dysfunction of lumbar region: Secondary | ICD-10-CM | POA: Diagnosis not present

## 2019-10-07 DIAGNOSIS — S338XXA Sprain of other parts of lumbar spine and pelvis, initial encounter: Secondary | ICD-10-CM | POA: Diagnosis not present

## 2019-10-29 ENCOUNTER — Other Ambulatory Visit: Payer: Self-pay | Admitting: Orthopaedic Surgery

## 2019-11-12 ENCOUNTER — Encounter (HOSPITAL_COMMUNITY): Payer: Self-pay

## 2019-11-12 ENCOUNTER — Ambulatory Visit (HOSPITAL_COMMUNITY)
Admission: EM | Admit: 2019-11-12 | Discharge: 2019-11-12 | Disposition: A | Discharge status: Home / Self Care | Payer: Medicare Other | Attending: Family Medicine | Admitting: Family Medicine

## 2019-11-12 ENCOUNTER — Other Ambulatory Visit: Payer: Self-pay

## 2019-11-12 DIAGNOSIS — Z8249 Family history of ischemic heart disease and other diseases of the circulatory system: Secondary | ICD-10-CM | POA: Diagnosis not present

## 2019-11-12 DIAGNOSIS — Z8052 Family history of malignant neoplasm of bladder: Secondary | ICD-10-CM | POA: Insufficient documentation

## 2019-11-12 DIAGNOSIS — Z79899 Other long term (current) drug therapy: Secondary | ICD-10-CM | POA: Diagnosis not present

## 2019-11-12 DIAGNOSIS — J0100 Acute maxillary sinusitis, unspecified: Secondary | ICD-10-CM | POA: Diagnosis not present

## 2019-11-12 DIAGNOSIS — Z823 Family history of stroke: Secondary | ICD-10-CM | POA: Diagnosis not present

## 2019-11-12 DIAGNOSIS — K219 Gastro-esophageal reflux disease without esophagitis: Secondary | ICD-10-CM | POA: Insufficient documentation

## 2019-11-12 DIAGNOSIS — R0981 Nasal congestion: Secondary | ICD-10-CM | POA: Diagnosis not present

## 2019-11-12 DIAGNOSIS — Z8349 Family history of other endocrine, nutritional and metabolic diseases: Secondary | ICD-10-CM | POA: Insufficient documentation

## 2019-11-12 DIAGNOSIS — R05 Cough: Secondary | ICD-10-CM

## 2019-11-12 DIAGNOSIS — Z20822 Contact with and (suspected) exposure to covid-19: Secondary | ICD-10-CM | POA: Diagnosis not present

## 2019-11-12 DIAGNOSIS — Z888 Allergy status to other drugs, medicaments and biological substances status: Secondary | ICD-10-CM | POA: Insufficient documentation

## 2019-11-12 DIAGNOSIS — J029 Acute pharyngitis, unspecified: Secondary | ICD-10-CM | POA: Diagnosis present

## 2019-11-12 DIAGNOSIS — Z87891 Personal history of nicotine dependence: Secondary | ICD-10-CM | POA: Insufficient documentation

## 2019-11-12 DIAGNOSIS — R059 Cough, unspecified: Secondary | ICD-10-CM

## 2019-11-12 LAB — SARS CORONAVIRUS 2 (TAT 6-24 HRS): SARS Coronavirus 2: NEGATIVE

## 2019-11-12 MED ORDER — AZITHROMYCIN 250 MG PO TABS: ORAL_TABLET | ORAL | 0 refills | Status: DC

## 2019-11-12 NOTE — ED Triage Notes (Signed)
Pt c/o sore throat, congestion, left ear pain for approx 1 week, night sweats, mild cough and diarrhea.   Denies abdom pain, n/v, fever, chills, body aches, SOB.  Pt states she attempted to use OTC sudafed but made it difficult to sleep. Has also been using Alka Seltzer Sinus/cold  Pt reports she has had both COVID vaccines, last one in February 2021.

## 2019-11-12 NOTE — Discharge Instructions (Signed)
You have a sinus infection.  I have prescribed a z-pack for you to take  Follow up with this office or with primary care if you are not feeling better over the next 2 days.  Follow up with the ER for trouble swallowing, trouble breathing, other concerning symptoms.

## 2019-11-12 NOTE — ED Provider Notes (Signed)
[facility]   [id number] 11/12/19 Arrival Time: 0800  [id number] THROAT  SUBJECTIVE: History from: patient.  Brandy Leonard is a 74 y.o. female who presents with abrupt onset of nasal congestion, headache, fatigue, left ear pain, cough for the last week. Denies to sick exposure to Covid, strep, flu or mono, or precipitating event. Has tried sudafed and alka seltzer cold and sinus without relief. There are no aggravating symptoms. Reports previous symptoms in the past.     Denies fever, fatigue, ear pain, rhinorrhea, cough, SOB, wheezing, chest pain, nausea, rash, changes in bowel or bladder habits.     ROS: As per HPI.  All other pertinent ROS negative.     Past Medical History:  Diagnosis Date  . Allergy    Benadryl, Flonase  . GERD (gastroesophageal reflux disease)   . Hemorrhoids   . Insomnia   . Lichen sclerosus of female genitalia    History reviewed. No pertinent surgical history. Allergies  Allergen Reactions  . Prednisone Swelling    Of face   No current facility-administered medications on file prior to encounter.   Current Outpatient Medications on File Prior to Encounter  Medication Sig Dispense Refill  . cholecalciferol (VITAMIN D3) 25 MCG (1000 UNIT) tablet Take 1,000 Units by mouth daily.    . diclofenac (VOLTAREN) 75 MG EC tablet TAKE 1 TABLET BY MOUTH TWICE A DAY BETWEEN MEALS AS NEEDED 60 tablet 0  . famotidine (PEPCID) 20 MG tablet Take 20 mg by mouth 2 (two) times daily.    . traZODone (DESYREL) 50 MG tablet Take 1 tablet (50 mg total) by mouth at bedtime. 30 tablet 0  . zinc gluconate 50 MG tablet Take 50 mg by mouth daily.    Marland Kitchen azelastine (ASTELIN) 0.1 % nasal spray Place 2 sprays 2 (two) times daily into both nostrils. Use in each nostril as directed 30 mL 12  . clobetasol cream (TEMOVATE) [id number] % Apply 1 application topically 2 (two) times daily. 30 g 11  . hydrocortisone (ANUSOL-HC) 2.5 % rectal cream Place 1 application 2 (two) times daily  rectally. 30 g 2  . hydrocortisone (ANUSOL-HC) 25 MG suppository Place 1 suppository (25 mg total) 2 (two) times daily rectally. 12 suppository 1  . hydrocortisone 2.5 % ointment Apply 2 (two) times daily topically. 30 g 5  . levocetirizine (XYZAL) 5 MG tablet Take 1 tablet (5 mg total) by mouth every evening. 60 tablet 0  . loratadine (CLARITIN) 10 MG tablet Take 1 tablet (10 mg total) by mouth daily. 30 tablet 0  . methocarbamol (ROBAXIN) 500 MG tablet TAKE 1 TABLET (500 MG TOTAL) BY MOUTH EVERY 6 (SIX) HOURS AS NEEDED FOR MUSCLE SPASMS. 60 tablet 1  . omeprazole (PRILOSEC) 20 MG capsule Take 1 capsule (20 mg total) by mouth daily. 60 capsule 0  . traMADol (ULTRAM) 50 MG tablet TAKE 1 TO 2 TABLETS BY MOUTH EVERY 6 HOURS AS NEEDED 40 tablet 0  . triamcinolone cream (KENALOG) 0.1 % Apply 1 application topically 2 (two) times daily. 30 g 0   Social History   Socioeconomic History  . Marital status: Divorced    Spouse name: Not on file  . Number of children: Not on file  . Years of education: Not on file  . Highest education level: Not on file  Occupational History  . Not on file  Tobacco Use  . Smoking status: Former Smoker    Packs/day: 0.50    Years: 20.00  Pack years: 10.00    Start date: 89    Quit date: 2000    Years since quitting: 21.4  . Smokeless tobacco: Never Used  Substance and Sexual Activity  . Alcohol use: Yes    Comment: Occasionally   . Drug use: No  . Sexual activity: Never  Other Topics Concern  . 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        Tobacco:  Quit smoking 1999.  Social smoker.       Alcohol:  Wine one glass per day.       Exercise:  Sporadic exercise.  Treadmill      Advanced Directive: +living will; HCPOA: brother Duffy Rhody). FULL CODE no prolonged measures.        Seatbelt:  100%;        ADLs: independent with ADLs. Drives.   Social Determinants of Health   Financial Resource Strain:   . Difficulty of Paying Living Expenses:   Food Insecurity:   . Worried About Charity fundraiser in the Last Year:   . Arboriculturist in the Last Year:   Transportation Needs:   . Film/video editor (Medical):   Marland Kitchen Lack of Transportation (Non-Medical):   Physical Activity:   . Days of Exercise per Week:   . Minutes of Exercise per Session:   Stress:   . Feeling of Stress :   Social Connections:   . Frequency of Communication with Friends and Family:   . Frequency of Social Gatherings with Friends and Family:   . Attends Religious Services:   . Active Member of Clubs or Organizations:   . Attends Archivist Meetings:   Marland Kitchen Marital Status:   Intimate Partner Violence:   . Fear of Current or Ex-Partner:   . Emotionally Abused:   Marland Kitchen Physically Abused:   . Sexually Abused:    Family History  Problem Relation Age of Onset  . Hypertension Mother   . Heart disease Mother   . Thyroid disease Father   . Cancer Father 74       Bladder cancer  . Stroke Father 35       CVA  . Hypertension Brother   . Stroke Maternal Grandmother   . Hypertension Maternal Grandmother   . Cancer Maternal Grandfather   . Hypertension Sister     OBJECTIVE:  Vitals:   11/12/19 0819  BP: (!) 141/86  Pulse: 91  Resp: 20  Temp: 98.2 F (36.8 C)  TempSrc: Oral  SpO2: 97%     General appearance: alert; appears fatigued, but nontoxic, speaking in full sentences and managing own secretions HEENT: NCAT; Ears: EACs clear, TMs pearly gray with visible cone of light, without erythema; Eyes: PERRL, EOMI grossly; Nose: no obvious rhinorrhea; Throat: oropharynx clear, tonsils 1+ and mildly erythematous without white tonsillar exudates, uvula midline; maxillary sinus tenderness present Neck: supple without LAD Lungs: CTA bilaterally without adventitious breath sounds; cough absent Heart:  regular rate and rhythm.  Radial pulses 2+ symmetrical bilaterally Skin: warm and dry Psychological: alert and cooperative; normal mood and affect  LABS: No results found for this or any previous visit (from the past 24 hour(s)).   ASSESSMENT & PLAN:  1. Acute non-recurrent maxillary  sinusitis   2. Nasal congestion   3. Cough     Meds ordered this encounter  Medications  . azithromycin (ZITHROMAX Z-PAK) 250 MG tablet    Sig: Take 2 tablets today, and 1 tablet for the next 4 days.    Dispense:  6 tablet    Refill:  0    Order Specific Question:   Supervising Provider    Answer:   Chase Picket [id number]    Acute Sinusitis Push fluids and get rest Prescribed azithromycin  Take as directed and to completion Drink warm or cool liquids, use throat lozenges, or popsicles to help alleviate symptoms Take OTC ibuprofen or tylenol as needed for pain Follow up with PCP if symptoms persist Return or go to ER if you have any new or worsening symptoms such as fever, chills, nausea, vomiting, worsening sore throat, cough, abdominal pain, chest pain, changes in bowel or bladder habits.   Reviewed expectations re: course of current medical issues. Questions answered. Outlined signs and symptoms indicating need for more acute intervention. Patient verbalized understanding. After Visit Summary given.           Faustino Congress, NP 11/12/19 [phone]

## 2019-11-22 ENCOUNTER — Other Ambulatory Visit: Payer: Self-pay | Admitting: Orthopaedic Surgery

## 2019-12-17 DIAGNOSIS — K649 Unspecified hemorrhoids: Secondary | ICD-10-CM | POA: Diagnosis not present

## 2019-12-17 DIAGNOSIS — G4700 Insomnia, unspecified: Secondary | ICD-10-CM | POA: Diagnosis not present

## 2019-12-17 DIAGNOSIS — D229 Melanocytic nevi, unspecified: Secondary | ICD-10-CM | POA: Diagnosis not present

## 2019-12-17 DIAGNOSIS — M8580 Other specified disorders of bone density and structure, unspecified site: Secondary | ICD-10-CM | POA: Diagnosis not present

## 2019-12-17 DIAGNOSIS — J309 Allergic rhinitis, unspecified: Secondary | ICD-10-CM | POA: Diagnosis not present

## 2019-12-17 DIAGNOSIS — J3489 Other specified disorders of nose and nasal sinuses: Secondary | ICD-10-CM | POA: Diagnosis not present

## 2019-12-17 DIAGNOSIS — K219 Gastro-esophageal reflux disease without esophagitis: Secondary | ICD-10-CM | POA: Diagnosis not present

## 2019-12-17 DIAGNOSIS — E785 Hyperlipidemia, unspecified: Secondary | ICD-10-CM | POA: Diagnosis not present

## 2019-12-17 DIAGNOSIS — B0229 Other postherpetic nervous system involvement: Secondary | ICD-10-CM | POA: Diagnosis not present

## 2019-12-18 ENCOUNTER — Other Ambulatory Visit: Payer: Self-pay | Admitting: Internal Medicine

## 2019-12-18 DIAGNOSIS — M8580 Other specified disorders of bone density and structure, unspecified site: Secondary | ICD-10-CM

## 2020-04-18 DIAGNOSIS — Z23 Encounter for immunization: Secondary | ICD-10-CM | POA: Diagnosis not present

## 2020-05-20 ENCOUNTER — Other Ambulatory Visit: Payer: Self-pay | Admitting: Internal Medicine

## 2020-05-20 DIAGNOSIS — Z1231 Encounter for screening mammogram for malignant neoplasm of breast: Secondary | ICD-10-CM

## 2020-07-28 ENCOUNTER — Other Ambulatory Visit: Payer: Self-pay | Admitting: Internal Medicine

## 2020-07-28 DIAGNOSIS — E2839 Other primary ovarian failure: Secondary | ICD-10-CM

## 2020-09-05 ENCOUNTER — Ambulatory Visit
Admission: RE | Admit: 2020-09-05 | Discharge: 2020-09-05 | Disposition: A | Discharge status: Home / Self Care | Payer: Medicare Other | Source: Ambulatory Visit | Attending: Internal Medicine | Admitting: Internal Medicine

## 2020-09-05 ENCOUNTER — Other Ambulatory Visit: Payer: Medicare Other

## 2020-09-05 ENCOUNTER — Other Ambulatory Visit: Payer: Self-pay

## 2020-09-05 DIAGNOSIS — Z1231 Encounter for screening mammogram for malignant neoplasm of breast: Secondary | ICD-10-CM | POA: Diagnosis not present

## 2020-10-21 DIAGNOSIS — Z23 Encounter for immunization: Secondary | ICD-10-CM | POA: Diagnosis not present

## 2020-10-31 ENCOUNTER — Other Ambulatory Visit: Payer: Self-pay

## 2020-10-31 ENCOUNTER — Ambulatory Visit
Admission: RE | Admit: 2020-10-31 | Discharge: 2020-10-31 | Disposition: A | Discharge status: Home / Self Care | Payer: Medicare Other | Source: Ambulatory Visit | Attending: Internal Medicine | Admitting: Internal Medicine

## 2020-10-31 DIAGNOSIS — M8589 Other specified disorders of bone density and structure, multiple sites: Secondary | ICD-10-CM | POA: Diagnosis not present

## 2020-10-31 DIAGNOSIS — E2839 Other primary ovarian failure: Secondary | ICD-10-CM

## 2020-10-31 DIAGNOSIS — Z780 Asymptomatic menopausal state: Secondary | ICD-10-CM | POA: Diagnosis not present

## 2020-11-25 DIAGNOSIS — H04123 Dry eye syndrome of bilateral lacrimal glands: Secondary | ICD-10-CM | POA: Diagnosis not present

## 2020-11-25 DIAGNOSIS — H25013 Cortical age-related cataract, bilateral: Secondary | ICD-10-CM | POA: Diagnosis not present

## 2020-11-25 DIAGNOSIS — H353131 Nonexudative age-related macular degeneration, bilateral, early dry stage: Secondary | ICD-10-CM | POA: Diagnosis not present

## 2020-11-25 DIAGNOSIS — H2513 Age-related nuclear cataract, bilateral: Secondary | ICD-10-CM | POA: Diagnosis not present

## 2020-11-25 DIAGNOSIS — H524 Presbyopia: Secondary | ICD-10-CM | POA: Diagnosis not present

## 2020-12-19 DIAGNOSIS — J309 Allergic rhinitis, unspecified: Secondary | ICD-10-CM | POA: Diagnosis not present

## 2020-12-19 DIAGNOSIS — Z0000 Encounter for general adult medical examination without abnormal findings: Secondary | ICD-10-CM | POA: Diagnosis not present

## 2020-12-19 DIAGNOSIS — E785 Hyperlipidemia, unspecified: Secondary | ICD-10-CM | POA: Diagnosis not present

## 2020-12-19 DIAGNOSIS — K219 Gastro-esophageal reflux disease without esophagitis: Secondary | ICD-10-CM | POA: Diagnosis not present

## 2020-12-19 DIAGNOSIS — Z1389 Encounter for screening for other disorder: Secondary | ICD-10-CM | POA: Diagnosis not present

## 2020-12-19 DIAGNOSIS — E559 Vitamin D deficiency, unspecified: Secondary | ICD-10-CM | POA: Diagnosis not present

## 2020-12-19 DIAGNOSIS — R030 Elevated blood-pressure reading, without diagnosis of hypertension: Secondary | ICD-10-CM | POA: Diagnosis not present

## 2020-12-19 DIAGNOSIS — Z1159 Encounter for screening for other viral diseases: Secondary | ICD-10-CM | POA: Diagnosis not present

## 2020-12-19 DIAGNOSIS — M8580 Other specified disorders of bone density and structure, unspecified site: Secondary | ICD-10-CM | POA: Diagnosis not present

## 2020-12-19 DIAGNOSIS — G4700 Insomnia, unspecified: Secondary | ICD-10-CM | POA: Diagnosis not present

## 2020-12-19 DIAGNOSIS — B0229 Other postherpetic nervous system involvement: Secondary | ICD-10-CM | POA: Diagnosis not present

## 2021-03-18 ENCOUNTER — Ambulatory Visit (INDEPENDENT_AMBULATORY_CARE_PROVIDER_SITE_OTHER): Payer: Medicare Other

## 2021-03-18 ENCOUNTER — Other Ambulatory Visit: Payer: Self-pay

## 2021-03-18 ENCOUNTER — Encounter (HOSPITAL_COMMUNITY): Payer: Self-pay

## 2021-03-18 ENCOUNTER — Ambulatory Visit (HOSPITAL_COMMUNITY)
Admission: EM | Admit: 2021-03-18 | Discharge: 2021-03-18 | Disposition: A | Discharge status: Home / Self Care | Payer: Medicare Other | Attending: Family Medicine | Admitting: Family Medicine

## 2021-03-18 DIAGNOSIS — M1611 Unilateral primary osteoarthritis, right hip: Secondary | ICD-10-CM | POA: Diagnosis not present

## 2021-03-18 DIAGNOSIS — W19XXXA Unspecified fall, initial encounter: Secondary | ICD-10-CM

## 2021-03-18 DIAGNOSIS — Z043 Encounter for examination and observation following other accident: Secondary | ICD-10-CM | POA: Diagnosis not present

## 2021-03-18 DIAGNOSIS — M79641 Pain in right hand: Secondary | ICD-10-CM | POA: Diagnosis not present

## 2021-03-18 DIAGNOSIS — M25511 Pain in right shoulder: Secondary | ICD-10-CM | POA: Diagnosis not present

## 2021-03-18 DIAGNOSIS — M25551 Pain in right hip: Secondary | ICD-10-CM

## 2021-03-18 DIAGNOSIS — S0083XA Contusion of other part of head, initial encounter: Secondary | ICD-10-CM

## 2021-03-18 MED ORDER — CYCLOBENZAPRINE HCL 5 MG PO TABS: 5.0000 mg | ORAL_TABLET | Freq: Two times a day (BID) | ORAL | 0 refills | Status: DC | PRN

## 2021-03-18 NOTE — ED Triage Notes (Signed)
Pt reports falling yesterday. States she has been unable to walk like normal since yesterday and states today she is feeling worse than yesterday. Pt states she has hip, back and nose pain.
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
## 2021-04-05 DIAGNOSIS — Z23 Encounter for immunization: Secondary | ICD-10-CM | POA: Diagnosis not present

## 2021-05-12 DIAGNOSIS — H25012 Cortical age-related cataract, left eye: Secondary | ICD-10-CM | POA: Diagnosis not present

## 2021-05-12 DIAGNOSIS — R519 Headache, unspecified: Secondary | ICD-10-CM | POA: Diagnosis not present

## 2021-05-12 DIAGNOSIS — H2513 Age-related nuclear cataract, bilateral: Secondary | ICD-10-CM | POA: Diagnosis not present

## 2021-05-12 DIAGNOSIS — H353131 Nonexudative age-related macular degeneration, bilateral, early dry stage: Secondary | ICD-10-CM | POA: Diagnosis not present

## 2021-07-13 DIAGNOSIS — H04123 Dry eye syndrome of bilateral lacrimal glands: Secondary | ICD-10-CM | POA: Diagnosis not present

## 2021-07-13 DIAGNOSIS — R519 Headache, unspecified: Secondary | ICD-10-CM | POA: Diagnosis not present

## 2021-07-13 DIAGNOSIS — H0264 Xanthelasma of left upper eyelid: Secondary | ICD-10-CM | POA: Diagnosis not present

## 2021-08-03 ENCOUNTER — Ambulatory Visit (INDEPENDENT_AMBULATORY_CARE_PROVIDER_SITE_OTHER): Payer: Medicare Other | Admitting: Physician Assistant

## 2021-08-03 ENCOUNTER — Ambulatory Visit: Payer: Self-pay

## 2021-08-03 ENCOUNTER — Encounter: Payer: Self-pay | Admitting: Physician Assistant

## 2021-08-03 DIAGNOSIS — M5441 Lumbago with sciatica, right side: Secondary | ICD-10-CM

## 2021-08-03 MED ORDER — METHOCARBAMOL 500 MG PO TABS: 500.0000 mg | ORAL_TABLET | Freq: Four times a day (QID) | ORAL | 1 refills | Status: AC | PRN

## 2021-08-03 MED ORDER — DICLOFENAC SODIUM 75 MG PO TBEC: DELAYED_RELEASE_TABLET | ORAL | 0 refills | Status: AC
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
## 2021-08-04 NOTE — Addendum Note (Signed)
Addended by: Robyne Peers on: 08/04/2021 08:32 AM   Modules accepted: Orders

## 2021-08-14 ENCOUNTER — Ambulatory Visit (HOSPITAL_COMMUNITY)
Admission: EM | Admit: 2021-08-14 | Discharge: 2021-08-14 | Disposition: A | Discharge status: Home / Self Care | Payer: Medicare Other | Attending: Physician Assistant | Admitting: Physician Assistant

## 2021-08-14 ENCOUNTER — Other Ambulatory Visit: Payer: Self-pay

## 2021-08-14 ENCOUNTER — Encounter (HOSPITAL_COMMUNITY): Payer: Self-pay | Admitting: *Deleted

## 2021-08-14 DIAGNOSIS — J0140 Acute pansinusitis, unspecified: Secondary | ICD-10-CM

## 2021-08-14 MED ORDER — AMOXICILLIN-POT CLAVULANATE 875-125 MG PO TABS: 1.0000 | ORAL_TABLET | Freq: Two times a day (BID) | ORAL | 0 refills | Status: DC
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
## 2021-08-14 NOTE — Discharge Instructions (Signed)
Take Augmentin twice daily.  Continue over-the-counter medications including allergy medicine as previously prescribed.  If your symptoms or not improving quickly please follow-up with ENT; call to schedule an appointment.   ?
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
## 2021-08-28 ENCOUNTER — Other Ambulatory Visit: Payer: Self-pay

## 2021-08-28 ENCOUNTER — Encounter: Payer: Self-pay | Admitting: Physical Therapy

## 2021-08-28 ENCOUNTER — Ambulatory Visit: Payer: Medicare Other | Admitting: Physical Therapy

## 2021-08-28 DIAGNOSIS — Z9181 History of falling: Secondary | ICD-10-CM

## 2021-08-28 DIAGNOSIS — M6281 Muscle weakness (generalized): Secondary | ICD-10-CM

## 2021-08-28 DIAGNOSIS — M5441 Lumbago with sciatica, right side: Secondary | ICD-10-CM

## 2021-08-28 NOTE — Therapy (Signed)
?OUTPATIENT PHYSICAL THERAPY THORACOLUMBAR EVALUATION ? ? ?Patient Name: Brandy Leonard ?MRN: [id number] ?DOB:February 22, 1946, 76 y.o., female ?Today's Date: 08/28/2021 ? ? PT End of Session - 08/28/21 1005   ? ? Visit Number 1   ? Number of Visits 6   ? Date for PT Re-Evaluation 10/09/21   ? Authorization Type [employer] Medicare   ? Progress Note Due on Visit 10   ? PT Start Time [id number]   ? PT Stop Time 0959   ? PT Time Calculation (min) 34 min   ? Activity Tolerance Patient tolerated treatment well   ? Behavior During Therapy [facility] for tasks assessed/performed   ? ?  ?  ? ?  ? ? ?Past Medical History:  ?Diagnosis Date  ? Allergy   ? Benadryl, Flonase  ? GERD (gastroesophageal reflux disease)   ? Hemorrhoids   ? Insomnia   ? Lichen sclerosus of female genitalia   ? ?History reviewed. No pertinent surgical history. ?Patient Active Problem List  ? Diagnosis Date Noted  ? First degree hemorrhoids 02/08/2015  ? Lichen sclerosus of female genitalia 02/08/2015  ? Allergic rhinitis due to pollen 02/08/2015  ? Insomnia 02/26/2012  ? ? ?PCP: Wenda Low, MD ? ?REFERRING PROVIDER: Pete Pelt, PA-C ? ?REFERRING DIAG: M54.41 (ICD-10-CM) - Acute right-sided low back pain with right-sided sciatica  ? ?THERAPY DIAG:  ?Acute right-sided low back pain with right-sided sciatica - Plan: PT plan of care cert/re-cert ? ?Muscle weakness (generalized) - Plan: PT plan of care cert/re-cert ? ?History of falling - Plan: PT plan of care cert/re-cert ? ?ONSET DATE: Oct 2022 ? ?SUBJECTIVE:                                                                                                                                                                                          ? ?SUBJECTIVE STATEMENT: ?Pt is a 76 y/o female who presents to [facility] for subacute LBP following a fall in the fall of 2022.  She fell on her right hand side, but after some time was able to drive home.  She was unable to weight bear on Rt.  She was able to walk with a cane the next  day.  Xrays negative for fracture, but feels she has a "giant charlie horse" in Rt quad and some tingling in Rt calf.   ? ?PERTINENT HISTORY:  ?N/a ? ?PAIN:  ?Are you having pain? Yes: NPRS scale: 0, up to 5/10 ?Pain location: Rt quad, some pain in upper back ?Pain description: aching, subacute ?Aggravating factors: walking (limited to 3-4 hours of activity) ?Relieving factors: heat, rest ? ? ?PRECAUTIONS: None ? ?  WEIGHT BEARING RESTRICTIONS No ? ?FALLS:  ?Has patient fallen in last 6 months? Yes, Number of falls: 1 ? ?LIVING ENVIRONMENT: ?Lives with: lives alone ?Lives in: House/apartment; high rise condo ?Stairs: No; has elevator ?Has following equipment at home: None ? 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 ?PLOF: Independent and Leisure: [employer] of [place], go to dinner/movies with friends ? ?PATIENT GOALS improve pain, walk longer than she can now ? ? ?OBJECTIVE:  ? ?DIAGNOSTIC FINDINGS:  ?X-ray: Sclerotic activity at the L1-L2  ?level could represent an old pars fracture.  No spondylolisthesis.  This  ?space overall well-maintained.  Facet arthritis lower lumbar.  Anterior  ?endplate spurring.  Normal lordotic curvature ? ?PATIENT SURVEYS:  ?08/31/21 FOTO 58 (predicted 42) ? ?SCREENING FOR RED FLAGS: ?Bowel or bladder incontinence: No ? ?COGNITION: ? Overall cognitive status: Within functional limits for tasks assessed   ?  ?SENSATION: ?Reports some tingling in Rt calf ? ?MUSCLE LENGTH: ?Hamstrings: mild tightness ?Quadriceps: Tightness noted on Rt ? ? ?PALPATION: ?Trigger points noted in glute min and med on Rt; pain L2/3 CPA mobs ? ?LUMBAR ROM:  ? ?Active  A/PROM  ?08/28/2021  ?Flexion WNL  ?Extension WNL  ?Right lateral flexion WNL  ?Left lateral flexion WNL  ?Right rotation WNL  ?Left rotation WNL  ? (Blank rows = not tested) ? ?LE MMT: ? ?MMT Right ?08/28/2021 Left ?08/28/2021  ?Hip flexion 4/5 5/5  ?Hip extension 3/5 5/5  ?Hip abduction 3/5 5/5  ?Hip adduction     ?Hip internal rotation    ?Hip external rotation    ?Knee flexion 5/5 5/5  ?Knee extension 5/5 5/5  ?Ankle dorsiflexion 5/5 5/5  ?Ankle plantarflexion    ?Ankle inversion    ?Ankle eversion    ? (Blank rows = not tested) ? ?LUMBAR SPECIAL TESTS:  ?Slump test: Negative ? ? ?GAIT: ?Independent with amb ? ? ? ?TODAY'S TREATMENT  ?08/28/21 ? See pt instructions - performed trial reps PRN for comprehension  ? ? ?PATIENT EDUCATION:  ?Education details: HEP ?Person educated: Patient ?Education method: Explanation, Demonstration, and Handouts ?Education comprehension: verbalized understanding, returned demonstration, and needs further education ? ? ?HOME EXERCISE PROGRAM: ?Access Code: [email or web address] ?URL: [email or web address] ?Date: 08/28/2021 ?Prepared by: Faustino Congress ? ?Exercises ?Prone Quadriceps Stretch with Strap - 2 x daily - 7 x weekly - 1 sets - 3 reps - 30 sec hold ?Hooklying Single Knee to Chest - 2 x daily - 7 x weekly - 1 sets - 3 reps - 30 sec hold ?Sidelying Hip Abduction - 2 x daily - 7 x weekly - 1 sets - 10 reps ?Supine Bridge - 2 x daily - 7 x weekly - 1 sets - 10 reps - 5 sec hold ? ? ?ASSESSMENT: ? ?CLINICAL IMPRESSION: ?Patient is a 76 y.o. female who was seen today for physical therapy evaluation and treatment for LBP.  She demonstrates some mild strength deficits, trigger points, and decreased flexibility affecting functional mobility.  Will benefit from PT to address deficits listed.  ? ? ?OBJECTIVE IMPAIRMENTS decreased balance, decreased mobility, decreased strength, increased fascial restrictions, increased muscle spasms, impaired flexibility, and pain.  ? ?ACTIVITY LIMITATIONS cleaning, community activity, driving, occupation, and shopping.  ? ?PERSONAL FACTORS Time since onset of injury/illness/exacerbation are also affecting patient's functional outcome.  ? ? ?REHAB POTENTIAL: Good ? ?CLINICAL DECISION MAKING: Stable/uncomplicated ? ?EVALUATION COMPLEXITY:  Low ? ? ?GOALS: ?Goals reviewed with patient? Yes ? ?SHORT TERM GOALS: Target date:  09/18/2021 ? ?Independent with initial HEP ?Goal status: INITIAL ? ? ?LONG TERM GOALS: Target date: 10/09/2021 ? ?Independent with final HEP ?Goal status: INITIAL ? ?2.  FOTO score improved to 67 ?Goal status: INITIAL ? ?3.  Rt hip strength improved to 4/5 for improved strength and mobility ?Goal status: INITIAL ? ?4.  Report pain < 3/10 with activity > 5 hours for improved function ?Goal status: INITIAL ? ? ?PLAN: ?PT FREQUENCY: 1x/week ? ?PT DURATION: 6 weeks ? ?PLANNED INTERVENTIONS: Therapeutic exercises, Therapeutic activity, Neuromuscular re-education, Balance training, Patient/Family education, Joint mobilization, Dry Needling, Electrical stimulation, Spinal manipulation, Spinal mobilization, Cryotherapy, Moist heat, Taping, Traction, and Manual therapy. ? ?PLAN FOR NEXT SESSION: review HEP, add balance/standing strengthening exercises, consider manual/DN/modalities PRN  ? ? ? ? ?Laureen Abrahams, PT, DPT ?08/28/21 10:07 AM ? ? ?

## 2021-09-11 ENCOUNTER — Ambulatory Visit (INDEPENDENT_AMBULATORY_CARE_PROVIDER_SITE_OTHER): Payer: Medicare Other | Admitting: Physical Therapy

## 2021-09-11 ENCOUNTER — Encounter: Payer: Self-pay | Admitting: Physical Therapy

## 2021-09-11 DIAGNOSIS — M5441 Lumbago with sciatica, right side: Secondary | ICD-10-CM | POA: Diagnosis not present

## 2021-09-11 DIAGNOSIS — M6281 Muscle weakness (generalized): Secondary | ICD-10-CM | POA: Diagnosis not present

## 2021-09-11 DIAGNOSIS — Z9181 History of falling: Secondary | ICD-10-CM

## 2021-09-11 NOTE — Therapy (Signed)
?OUTPATIENT PHYSICAL THERAPY TREATMENT NOTE ? ? ?Patient Name: Brandy Leonard ?MRN: [id number] ?DOB:[birth year], 76 y.o., female ?Today's Date: 09/11/2021 ? ?PCP: Wenda Low, MD ?REFERRING PROVIDER: Pete Pelt, PA-C ? ? PT End of Session - 09/11/21 1352   ? ? Visit Number 2   ? Number of Visits 6   ? Date for PT Re-Evaluation 10/09/21   ? Authorization Type [employer] Medicare   ? Progress Note Due on Visit 10   ? PT Start Time 1345   ? PT Stop Time 1426   ? PT Time Calculation (min) 41 min   ? Activity Tolerance Patient tolerated treatment well   ? Behavior During Therapy [facility] for tasks assessed/performed   ? ?  ?  ? ?  ? ? ?Past Medical History:  ?Diagnosis Date  ? Allergy   ? Benadryl, Flonase  ? GERD (gastroesophageal reflux disease)   ? Hemorrhoids   ? Insomnia   ? Lichen sclerosus of female genitalia   ? ?History reviewed. No pertinent surgical history. ?Patient Active Problem List  ? Diagnosis Date Noted  ? First degree hemorrhoids 02/08/2015  ? Lichen sclerosus of female genitalia 02/08/2015  ? Allergic rhinitis due to pollen 02/08/2015  ? Insomnia 02/26/2012  ? ? ?REFERRING DIAG: M54.41 (ICD-10-CM) - Acute right-sided low back pain with right-sided sciatica  ? ?THERAPY DIAG:  ?Acute right-sided low back pain with right-sided sciatica ? ?Muscle weakness (generalized) ? ?History of falling ? ?PERTINENT HISTORY: N/A ? ?PRECAUTIONS: None ? ?SUBJECTIVE: doing well ? ?PAIN:  ?Are you having pain? No ? ? ?OBJECTIVE:  ?  ?PATIENT SURVEYS:  ?08/31/21 FOTO 70 (predicted 9) ?  ?MUSCLE LENGTH: ?Hamstrings: mild tightness ?Quadriceps: Tightness noted on Rt ? ?  ?LUMBAR ROM:  ?  ?Active  A/PROM  ?08/28/2021  ?Flexion WNL  ?Extension WNL  ?Right lateral flexion WNL  ?Left lateral flexion WNL  ?Right rotation WNL  ?Left rotation WNL  ? (Blank rows = not tested) ?  ?LE MMT: ?  ?MMT Right ?08/28/2021 Left ?08/28/2021  ?Hip flexion 4/5 5/5  ?Hip extension 3/5 5/5  ?Hip abduction 3/5 5/5  ?Hip adduction      ?Hip internal  rotation      ?Hip external rotation      ?Knee flexion 5/5 5/5  ?Knee extension 5/5 5/5  ?Ankle dorsiflexion 5/5 5/5  ?Ankle plantarflexion      ?Ankle inversion      ?Ankle eversion      ? (Blank rows = not tested) ?  ?LUMBAR SPECIAL TESTS:  ?Slump test: Negative ?  ?  ?TODAY'S TREATMENT  ?09/11/21 ?Therex: ?     Aerobic: ?NuStep L6 x 8 min ?    Supine: ?SKTC 3x30 sec bil ?Bridging 10 x 5 sec hold ?    Sidelying: ?Hip Abduction x10 reps bil - min cues for technique ?    Prone: ?Quad Stretch 3x30 sec bil, with strap ? ? ? ?08/28/21 ?              See pt instructions - performed trial reps PRN for comprehension  ?  ?  ?PATIENT EDUCATION:  ?Education details: HEP ?Person educated: Patient ?Education method: Explanation, Demonstration, and Handouts ?Education comprehension: verbalized understanding, returned demonstration, and needs further education ?  ?  ?HOME EXERCISE PROGRAM: ?Access Code: [email or web address] ?URL: [email or web address] ?Date: 08/28/2021 ?Prepared by: Faustino Congress ?  ?Exercises ?Prone Quadriceps Stretch with Strap - 2 x daily - 7 x weekly - 1 sets - 3  reps - 30 sec hold ?Hooklying Single Knee to Chest - 2 x daily - 7 x weekly - 1 sets - 3 reps - 30 sec hold ?Sidelying Hip Abduction - 2 x daily - 7 x weekly - 1 sets - 10 reps ?Supine Bridge - 2 x daily - 7 x weekly - 1 sets - 10 reps - 5 sec hold ?  ?  ?ASSESSMENT: ?  ?CLINICAL IMPRESSION: ?Pt tolerated session well with main focus on review of HEP and pt able to perform independently.  Will continue to benefit from PT to maximize function. ? ?  ?  ?OBJECTIVE IMPAIRMENTS decreased balance, decreased mobility, decreased strength, increased fascial restrictions, increased muscle spasms, impaired flexibility, and pain.  ?  ?ACTIVITY LIMITATIONS cleaning, community activity, driving, occupation, and shopping.  ?  ?PERSONAL FACTORS Time since onset of injury/illness/exacerbation are also affecting patient's functional outcome.  ?  ?  ?REHAB  POTENTIAL: Good ?  ?CLINICAL DECISION MAKING: Stable/uncomplicated ?  ?EVALUATION COMPLEXITY: Low ?  ?  ?GOALS: ?Goals reviewed with patient? Yes ?  ?SHORT TERM GOALS: Target date: 09/18/2021 ?  ?Independent with initial HEP ?Goal status: INITIAL ?  ?  ?LONG TERM GOALS: Target date: 10/09/2021 ?  ?Independent with final HEP ?Goal status: INITIAL ?  ?2.  FOTO score improved to 67 ?Goal status: INITIAL ?  ?3.  Rt hip strength improved to 4/5 for improved strength and mobility ?Goal status: INITIAL ?  ?4.  Report pain < 3/10 with activity > 5 hours for improved function ?Goal status: INITIAL ?  ?  ?PLAN: ?PT FREQUENCY: 1x/week ?  ?PT DURATION: 6 weeks ?  ?PLANNED INTERVENTIONS: Therapeutic exercises, Therapeutic activity, Neuromuscular re-education, Balance training, Patient/Family education, Joint mobilization, Dry Needling, Electrical stimulation, Spinal manipulation, Spinal mobilization, Cryotherapy, Moist heat, Taping, Traction, and Manual therapy. ?  ?PLAN FOR NEXT SESSION: balance/standing strengthening,  consider manual/DN/modalities PRN  ?  ? ? ? ?Laureen Abrahams, PT, DPT ?09/11/21 2:27 PM ? ? ?  ? ?

## 2021-09-18 ENCOUNTER — Ambulatory Visit (INDEPENDENT_AMBULATORY_CARE_PROVIDER_SITE_OTHER): Payer: Medicare Other | Admitting: Physical Therapy

## 2021-09-18 ENCOUNTER — Encounter: Payer: Self-pay | Admitting: Physical Therapy

## 2021-09-18 DIAGNOSIS — M6281 Muscle weakness (generalized): Secondary | ICD-10-CM

## 2021-09-18 DIAGNOSIS — Z9181 History of falling: Secondary | ICD-10-CM | POA: Diagnosis not present

## 2021-09-18 DIAGNOSIS — M5441 Lumbago with sciatica, right side: Secondary | ICD-10-CM

## 2021-09-18 NOTE — Therapy (Signed)
?OUTPATIENT PHYSICAL THERAPY TREATMENT NOTE ? ? ?Patient Name: Brandy Leonard ?MRN: [id number] ?DOB:04/08/1946, 76 y.o., female ?Today's Date: 09/18/2021 ? ?PCP: Wenda Low, MD ?REFERRING PROVIDER: Pete Pelt, PA-C ? ? PT End of Session - 09/18/21 0943   ? ? Visit Number 3   ? Number of Visits 6   ? Date for PT Re-Evaluation 10/09/21   ? Authorization Type [employer] Medicare   ? Progress Note Due on Visit 10   ? PT Start Time 0930   ? PT Stop Time 1008   ? PT Time Calculation (min) 38 min   ? Activity Tolerance Patient tolerated treatment well   ? Behavior During Therapy [facility] for tasks assessed/performed   ? ?  ?  ? ?  ? ? ? ?Past Medical History:  ?Diagnosis Date  ? Allergy   ? Benadryl, Flonase  ? GERD (gastroesophageal reflux disease)   ? Hemorrhoids   ? Insomnia   ? Lichen sclerosus of female genitalia   ? ?History reviewed. No pertinent surgical history. ?Patient Active Problem List  ? Diagnosis Date Noted  ? First degree hemorrhoids 02/08/2015  ? Lichen sclerosus of female genitalia 02/08/2015  ? Allergic rhinitis due to pollen 02/08/2015  ? Insomnia 02/26/2012  ? ? ?REFERRING DIAG: M54.41 (ICD-10-CM) - Acute right-sided low back pain with right-sided sciatica  ? ?THERAPY DIAG:  ?Acute right-sided low back pain with right-sided sciatica ? ?Muscle weakness (generalized) ? ?History of falling ? ?PERTINENT HISTORY: N/A ? ?PRECAUTIONS: None ? 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 ?PAIN:  ?Are you having pain? Yes ?Intensity: 3/10 ?Location : Between scapulae and deep in Rt hip/thigh ?Description: sore ?Alleviating Factors: rolling pin ?Aggravating: crawling on floor ? ? ?OBJECTIVE:  ?  ?PATIENT SURVEYS:  ?08/31/21 FOTO 20 (predicted 34) ?  ?MUSCLE LENGTH: ?Hamstrings: mild tightness ?Quadriceps: Tightness noted on Rt ? ?  ?LUMBAR ROM:  ?  ?Active  A/PROM  ?08/28/2021  ?Flexion WNL  ?Extension WNL  ?Right lateral flexion WNL  ?Left  lateral flexion WNL  ?Right rotation WNL  ?Left rotation WNL  ? (Blank rows = not tested) ?  ?LE MMT: ?  ?MMT Right ?08/28/2021 Left ?08/28/2021  ?Hip flexion 4/5 5/5  ?Hip extension 3/5 5/5  ?Hip abduction 3/5 5/5  ?Hip adduction      ?Hip internal rotation      ?Hip external rotation      ?Knee flexion 5/5 5/5  ?Knee extension 5/5 5/5  ?Ankle dorsiflexion 5/5 5/5  ?Ankle plantarflexion      ?Ankle inversion      ?Ankle eversion      ? (Blank rows = not tested) ?  ?LUMBAR SPECIAL TESTS:  ?Slump test: Negative ?  ?  ?TODAY'S TREATMENT  ?09/18/21 ?Therex: ?     Aerobic: ?NuStep L7 x 8 min ?    Supine: ?SKTC 3x30 sec bil ?     Prone: ?Quad Stretch 3x30 sec bil, with strap ?       Standing: ?Calf raises x 20 reps ?Bil hip abdct 2x10 ?Bil hip ext 2x10 ?Squats 2x10 ?Self STM with tennis balls to mid thoracic ? ?09/11/21 ?Therex: ?     Aerobic: ?NuStep L6 x 8 min ?    Supine: ?SKTC 3x30 sec bil ?Bridging 10 x 5 sec hold ?    Sidelying: ?Hip Abduction x10 reps bil - min cues for technique ?  Prone: ?Quad Stretch 3x30 sec bil, with strap ? ? ? ?08/28/21 ?              See pt instructions - performed trial reps PRN for comprehension  ?  ?  ?PATIENT EDUCATION:  ?Education details: HEP ?Person educated: Patient ?Education method: Explanation, Demonstration, and Handouts ?Education comprehension: verbalized understanding, returned demonstration, and needs further education ?  ?  ?HOME EXERCISE PROGRAM: ?Access Code: [email or web address] ?URL: [email or web address] ?Date: 08/28/2021 ?Prepared by: Faustino Congress ?  ?Exercises ?Prone Quadriceps Stretch with Strap - 2 x daily - 7 x weekly - 1 sets - 3 reps - 30 sec hold ?Hooklying Single Knee to Chest - 2 x daily - 7 x weekly - 1 sets - 3 reps - 30 sec hold ?Sidelying Hip Abduction - 2 x daily - 7 x weekly - 1 sets - 10 reps ?Supine Bridge - 2 x daily - 7 x weekly - 1 sets - 10 reps - 5 sec hold ?  ?  ?ASSESSMENT: ?  ?CLINICAL IMPRESSION: ?Incorporated standing exercises today  which pt tolerated well.  Will continue to benefit from PT to maximize functional mobility. ?  ?OBJECTIVE IMPAIRMENTS decreased balance, decreased mobility, decreased strength, increased fascial restrictions, increased muscle spasms, impaired flexibility, and pain.  ?  ?ACTIVITY LIMITATIONS cleaning, community activity, driving, occupation, and shopping.  ?  ?PERSONAL FACTORS Time since onset of injury/illness/exacerbation are also affecting patient's functional outcome.  ?  ?  ?REHAB POTENTIAL: Good ?  ?CLINICAL DECISION MAKING: Stable/uncomplicated ?  ?EVALUATION COMPLEXITY: Low ?  ?  ?GOALS: ?Goals reviewed with patient? Yes ?  ?SHORT TERM GOALS: Target date: 09/18/2021 ?  ?Independent with initial HEP ?Goal status: MET 09/11/21 ?  ?  ?LONG TERM GOALS: Target date: 10/09/2021 ?  ?Independent with final HEP ?Goal status: INITIAL ?  ?2.  FOTO score improved to 67 ?Goal status: INITIAL ?  ?3.  Rt hip strength improved to 4/5 for improved strength and mobility ?Goal status: INITIAL ?  ?4.  Report pain < 3/10 with activity > 5 hours for improved function ?Goal status: INITIAL ?  ?  ?PLAN: ?PT FREQUENCY: 1x/week ?  ?PT DURATION: 6 weeks ?  ?PLANNED INTERVENTIONS: Therapeutic exercises, Therapeutic activity, Neuromuscular re-education, Balance training, Patient/Family education, Joint mobilization, Dry Needling, Electrical stimulation, Spinal manipulation, Spinal mobilization, Cryotherapy, Moist heat, Taping, Traction, and Manual therapy. ?  ?PLAN FOR NEXT SESSION: give standing HEP, balance/standing strengthening,  consider manual/DN/modalities PRN  ?  ? ? ? ?Laureen Abrahams, PT, DPT ?09/18/21 10:35 AM ? ? ?  ? ?

## 2021-09-25 ENCOUNTER — Encounter: Payer: Self-pay | Admitting: Physical Therapy

## 2021-09-25 ENCOUNTER — Ambulatory Visit (INDEPENDENT_AMBULATORY_CARE_PROVIDER_SITE_OTHER): Payer: Medicare Other | Admitting: Physical Therapy

## 2021-09-25 DIAGNOSIS — Z9181 History of falling: Secondary | ICD-10-CM | POA: Diagnosis not present

## 2021-09-25 DIAGNOSIS — M5441 Lumbago with sciatica, right side: Secondary | ICD-10-CM

## 2021-09-25 DIAGNOSIS — M6281 Muscle weakness (generalized): Secondary | ICD-10-CM

## 2021-09-25 NOTE — Therapy (Signed)
?OUTPATIENT PHYSICAL THERAPY TREATMENT NOTE ? ? ?Patient Name: Brandy Leonard ?MRN: [id number] ?DOB:June 04, 1946, 76 y.o., female ?Today's Date: 09/25/2021 ? ?PCP: Wenda Low, MD ?REFERRING PROVIDER: Pete Pelt, PA-C ? ? PT End of Session - 09/25/21 0930   ? ? Visit Number 4   ? Number of Visits 6   ? Date for PT Re-Evaluation 10/09/21   ? Authorization Type [employer] Medicare   ? Progress Note Due on Visit 10   ? PT Start Time [phone]   ? PT Stop Time 1010   ? PT Time Calculation (min) 41 min   ? Activity Tolerance Patient tolerated treatment well   ? Behavior During Therapy [facility] for tasks assessed/performed   ? ?  ?  ? ?  ? ? ? ? ?Past Medical History:  ?Diagnosis Date  ? Allergy   ? Benadryl, Flonase  ? GERD (gastroesophageal reflux disease)   ? Hemorrhoids   ? Insomnia   ? Lichen sclerosus of female genitalia   ? ?History reviewed. No pertinent surgical history. ?Patient Active Problem List  ? Diagnosis Date Noted  ? First degree hemorrhoids 02/08/2015  ? Lichen sclerosus of female genitalia 02/08/2015  ? Allergic rhinitis due to pollen 02/08/2015  ? Insomnia 02/26/2012  ? ? ?REFERRING DIAG: M54.41 (ICD-10-CM) - Acute right-sided low back pain with right-sided sciatica  ? ?THERAPY DIAG:  ?Acute right-sided low back pain with right-sided sciatica ? ?Muscle weakness (generalized) ? ?History of falling ? ?PERTINENT HISTORY: N/A ? ?PRECAUTIONS: None ? ?SUBJECTIVE: no pain today, doing well  ? ?PAIN:  ?Are you having pain? Yes ?Intensity: 3/10 ?Location : Between scapulae and deep in Rt hip/thigh ?Description: sore ?Alleviating Factors: rolling pin ?Aggravating: crawling on floor ? ? ?OBJECTIVE:  ?  ?PATIENT SURVEYS:  ?08/31/21 FOTO 30 (predicted 49) ?  ?MUSCLE LENGTH: ?Hamstrings: mild tightness ?Quadriceps: Tightness noted on Rt ? ?  ?LUMBAR ROM:  ?  ?Active  A/PROM  ?08/28/2021  ?Flexion WNL  ?Extension WNL  ?Right lateral flexion WNL  ?Left lateral flexion WNL  ?Right rotation WNL  ?Left rotation WNL  ? (Blank rows =  not tested) ?  ?LE MMT: ?  ?MMT Right ?08/28/2021 Left ?08/28/2021  ?Hip flexion 4/5 5/5  ?Hip extension 3/5 5/5  ?Hip abduction 3/5 5/5  ?Hip adduction      ?Hip internal rotation      ?Hip external rotation      ?Knee flexion 5/5 5/5  ?Knee extension 5/5 5/5  ?Ankle dorsiflexion 5/5 5/5  ?Ankle plantarflexion      ?Ankle inversion      ?Ankle eversion      ? (Blank rows = not tested) ?  ?LUMBAR SPECIAL TESTS:  ?Slump test: Negative ?  ?  ?TODAY'S TREATMENT  ?09/25/21 ?Therex: ?     Aerobic: ?NuStep L7 x 8 min ?     Prone: ?Quad Stretch 3x30 sec bil, with strap ?       Standing: ?Bil hip abdct 2x10 ?Bil hip ext 2x10; 4# ?  Neuro Re-Ed ?SLS 3x15 sec with UE support PRN ?Forward step ups without UE support 2x10 bil ? ?09/18/21 ?Therex: ?     Aerobic: ?NuStep L7 x 8 min ?    Supine: ?SKTC 3x30 sec bil ?     Prone: ?Quad Stretch 3x30 sec bil, with strap ?       Standing: ?Calf raises x 20 reps ?Bil hip abdct 2x10 ?Bil hip ext 2x10 ?Squats 2x10 ?Self STM with tennis  balls to mid thoracic ? ?09/11/21 ?Therex: ?     Aerobic: ?NuStep L6 x 8 min ?    Supine: ?SKTC 3x30 sec bil ?Bridging 10 x 5 sec hold ?    Sidelying: ?Hip Abduction x10 reps bil - min cues for technique ?    Prone: ?Quad Stretch 3x30 sec bil, with strap ? ? ? ?08/28/21 ?              See pt instructions - performed trial reps PRN for comprehension  ?  ?  ?PATIENT EDUCATION:  ?Education details: HEP ?Person educated: Patient ?Education method: Explanation, Demonstration, and Handouts ?Education comprehension: verbalized understanding, returned demonstration, and needs further education ?  ?  ?HOME EXERCISE PROGRAM: ?Access Code: [email or web address] ?URL: [email or web address] ?Date: 09/25/2021 ?Prepared by: Faustino Congress ? ?Exercises ?- Prone Quadriceps Stretch with Strap  - 2 x daily - 7 x weekly - 1 sets - 3 reps - 30 sec hold ?- Hooklying Single Knee to Chest  - 2 x daily - 7 x weekly - 1 sets - 3 reps - 30 sec hold ?- Sidelying Hip Abduction  - 2 x  daily - 7 x weekly - 1 sets - 10 reps ?- Supine Bridge  - 2 x daily - 7 x weekly - 1 sets - 10 reps - 5 sec hold ?- Standing Hip Extension with Counter Support  - 1 x daily - 7 x weekly - 1 sets - 20 reps ?- Standing Hip Abduction with Counter Support  - 1 x daily - 7 x weekly - 1 sets - 20 reps ?- Standing Single Leg Stance with Counter Support  - 1 x daily - 7 x weekly - 1 sets - 3-5 reps - 15 sec hold ?  ?  ?ASSESSMENT: ?  ?CLINICAL IMPRESSION: ?Pt tolerated session well today, incorporating increased balance exercises today with good tolerance.  Will continue to benefit from PT to maximize function. ? ?OBJECTIVE IMPAIRMENTS decreased balance, decreased mobility, decreased strength, increased fascial restrictions, increased muscle spasms, impaired flexibility, and pain.  ?  ?ACTIVITY LIMITATIONS cleaning, community activity, driving, occupation, and shopping.  ?  ?PERSONAL FACTORS Time since onset of injury/illness/exacerbation are also affecting patient's functional outcome.  ?  ?  ?REHAB POTENTIAL: Good ?  ?CLINICAL DECISION MAKING: Stable/uncomplicated ?  ?EVALUATION COMPLEXITY: Low ?  ?  ?GOALS: ?Goals reviewed with patient? Yes ?  ?SHORT TERM GOALS: Target date: 09/18/2021 ?  ?Independent with initial HEP ?Goal status: MET 09/11/21 ?  ?  ?LONG TERM GOALS: Target date: 10/09/2021 ?  ?Independent with final HEP ?Goal status: INITIAL ?  ?2.  FOTO score improved to 67 ?Goal status: INITIAL ?  ?3.  Rt hip strength improved to 4/5 for improved strength and mobility ?Goal status: INITIAL ?  ?4.  Report pain < 3/10 with activity > 5 hours for improved function ?Goal status: INITIAL ?  ?  ?PLAN: ?PT FREQUENCY: 1x/week ?  ?PT DURATION: 6 weeks ?  ?PLANNED INTERVENTIONS: Therapeutic exercises, Therapeutic activity, Neuromuscular re-education, Balance training, Patient/Family education, Joint mobilization, Dry Needling, Electrical stimulation, Spinal manipulation, Spinal mobilization, Cryotherapy, Moist heat, Taping,  Traction, and Manual therapy. ?  ?PLAN FOR NEXT SESSION: continue balance/standing strengthening,  consider manual/DN/modalities PRN  ?  ? ? ? ?Laureen Abrahams, PT, DPT ?09/25/21 10:12 AM ? ? ?  ? ?

## 2021-10-02 ENCOUNTER — Ambulatory Visit (INDEPENDENT_AMBULATORY_CARE_PROVIDER_SITE_OTHER): Payer: Medicare Other | Admitting: Physical Therapy

## 2021-10-02 ENCOUNTER — Encounter: Payer: Self-pay | Admitting: Physical Therapy

## 2021-10-02 DIAGNOSIS — Z9181 History of falling: Secondary | ICD-10-CM

## 2021-10-02 DIAGNOSIS — M5441 Lumbago with sciatica, right side: Secondary | ICD-10-CM

## 2021-10-02 DIAGNOSIS — M6281 Muscle weakness (generalized): Secondary | ICD-10-CM | POA: Diagnosis not present

## 2021-10-02 NOTE — Therapy (Signed)
?OUTPATIENT PHYSICAL THERAPY TREATMENT NOTE ?DISCHARGE SUMMARY ? ? ?Patient Name: Brandy Leonard ?MRN: [id number] ?DOB:25-Oct-1945, 76 y.o., female ?Today's Date: 10/02/2021 ? ?PCP: Wenda Low, MD ?REFERRING PROVIDER: Pete Pelt, PA-C ? ? ? ? ? ? ?Past Medical History:  ?Diagnosis Date  ? Allergy   ? Benadryl, Flonase  ? GERD (gastroesophageal reflux disease)   ? Hemorrhoids   ? Insomnia   ? Lichen sclerosus of female genitalia   ? ?No past surgical history on file. ?Patient Active Problem List  ? Diagnosis Date Noted  ? First degree hemorrhoids 02/08/2015  ? Lichen sclerosus of female genitalia 02/08/2015  ? Allergic rhinitis due to pollen 02/08/2015  ? Insomnia 02/26/2012  ? ? ?REFERRING DIAG: M54.41 (ICD-10-CM) - Acute right-sided low back pain with right-sided sciatica  ? ?THERAPY DIAG:  ?No diagnosis found. ? ?PERTINENT HISTORY: N/A ? ?PRECAUTIONS: None ? ?SUBJECTIVE: no pain today, doing well  ? ?PAIN:  ?Are you having pain? Yes ?Intensity: 3/10 ?Location : Between scapulae and deep in Rt hip/thigh ?Description: sore ?Alleviating Factors: rolling pin ?Aggravating: crawling on floor ? ? ?OBJECTIVE:  ?  ?PATIENT SURVEYS:  ?08/31/21 FOTO 58 (predicted 67) ?10/02/21 FOTO 72 ?  ?MUSCLE LENGTH: ?Hamstrings: mild tightness ?Quadriceps: Tightness noted on Rt ? ?  ?LUMBAR ROM:  ?  ?Active  A/PROM  ?08/28/2021  ?Flexion WNL  ?Extension WNL  ?Right lateral flexion WNL  ?Left lateral flexion WNL  ?Right rotation WNL  ?Left rotation WNL  ? (Blank rows = not tested) ?  ?LE MMT: ?  ?MMT Right ?08/28/2021 Left ?08/28/2021 Right ?10/02/21  ?Hip flexion 4/5 5/5 5/5  ?Hip extension 3/5 5/5 4/5  ?Hip abduction 3/5 5/5 5/5  ?Hip adduction       ?Hip internal rotation       ?Hip external rotation       ?Knee flexion 5/5 5/5   ?Knee extension 5/5 5/5   ?Ankle dorsiflexion 5/5 5/5   ?Ankle plantarflexion       ?Ankle inversion       ?Ankle eversion       ? (Blank rows = not tested) ?  ?LUMBAR SPECIAL TESTS:  ?Slump test:  Negative ?  ?  ?TODAY'S TREATMENT  ?10/02/21 ?Therex: ?NuStep L6 x 8 min ?Discussed HEP, pt reports compliance and understanding demonstrated in clinic during previous sessions ? ?Reviewed FOTO score and current progress with pt. ? ? ?09/25/21 ?Therex: ?     Aerobic: ?NuStep L7 x 8 min ?     Prone: ?Quad Stretch 3x30 sec bil, with strap ?       Standing: ?Bil hip abdct 2x10 ?Bil hip ext 2x10; 4# ?  Neuro Re-Ed ?SLS 3x15 sec with UE support PRN ?Forward step ups without UE support 2x10 bil ? ?09/18/21 ?Therex: ?     Aerobic: ?NuStep L7 x 8 min ?    Supine: ?SKTC 3x30 sec bil ?     Prone: ?Quad Stretch 3x30 sec bil, with strap ?       Standing: ?Calf raises x 20 reps ?Bil hip abdct 2x10 ?Bil hip ext 2x10 ?Squats 2x10 ?Self STM with tennis balls to mid thoracic ? ?  ?  ?PATIENT EDUCATION:  ?Education details: HEP ?Person educated: Patient ?Education method: Explanation, Demonstration, and Handouts ?Education comprehension: verbalized understanding, returned demonstration, and needs further education ?  ?  ?HOME EXERCISE PROGRAM: ?Access Code: [email or web address] ?URL: [email or web address] ?Date: 09/25/2021 ?Prepared by: Faustino Congress ? ?Exercises ?- Prone Quadriceps Stretch with Strap  -  2 x daily - 7 x weekly - 1 sets - 3 reps - 30 sec hold ?- Hooklying Single Knee to Chest  - 2 x daily - 7 x weekly - 1 sets - 3 reps - 30 sec hold ?- Sidelying Hip Abduction  - 2 x daily - 7 x weekly - 1 sets - 10 reps ?- Supine Bridge  - 2 x daily - 7 x weekly - 1 sets - 10 reps - 5 sec hold ?- Standing Hip Extension with Counter Support  - 1 x daily - 7 x weekly - 1 sets - 20 reps ?- Standing Hip Abduction with Counter Support  - 1 x daily - 7 x weekly - 1 sets - 20 reps ?- Standing Single Leg Stance with Counter Support  - 1 x daily - 7 x weekly - 1 sets - 3-5 reps - 15 sec hold ?  ?  ?ASSESSMENT: ?  ?CLINICAL IMPRESSION: ?Pt has met all goals and feels ready to d/c from PT today.  She is independent with HEP and knows to  continue to maximize progress. ? ?OBJECTIVE IMPAIRMENTS decreased balance, decreased mobility, decreased strength, increased fascial restrictions, increased muscle spasms, impaired flexibility, and pain.  ?  ?ACTIVITY LIMITATIONS cleaning, community activity, driving, occupation, and shopping.  ?  ?PERSONAL FACTORS Time since onset of injury/illness/exacerbation are also affecting patient's functional outcome.  ?  ?  ?REHAB POTENTIAL: Good ?  ?CLINICAL DECISION MAKING: Stable/uncomplicated ?  ?EVALUATION COMPLEXITY: Low ?  ?  ?GOALS: ?Goals reviewed with patient? Yes ?  ?SHORT TERM GOALS: Target date: 09/18/2021 ?  ?Independent with initial HEP ?Goal status: MET 09/11/21 ?  ?  ?LONG TERM GOALS: Target date: 10/09/2021 ?  ?Independent with final HEP ?Goal status: MET 10/02/21 ?  ?2.  FOTO score improved to 67 ?Goal status: MET 10/02/21 ?  ?3.  Rt hip strength improved to 4/5 for improved strength and mobility ?Goal status: MET 10/02/21 ?  ?4.  Report pain < 3/10 with activity > 5 hours for improved function ?Goal status: MET 10/02/21 ?  ?  ?PLAN: ?PT FREQUENCY: 1x/week ?  ?PT DURATION: 6 weeks ?  ?PLANNED INTERVENTIONS: Therapeutic exercises, Therapeutic activity, Neuromuscular re-education, Balance training, Patient/Family education, Joint mobilization, Dry Needling, Electrical stimulation, Spinal manipulation, Spinal mobilization, Cryotherapy, Moist heat, Taping, Traction, and Manual therapy. ?  ?PLAN FOR NEXT SESSION: d/c PT today  ?  ? ? ? ?Laureen Abrahams, PT, DPT ?10/02/21 9:33 AM ? ? ?  ? ? ? ?PHYSICAL THERAPY DISCHARGE SUMMARY ? ?Visits from Start of Care: 5 ? ?Current functional level related to goals / functional outcomes: ?See above ?  ?Remaining deficits: ?See above ?  ?Education / Equipment: ?HEP  ? ?Patient agrees to discharge. Patient goals were met. Patient is being discharged due to meeting the stated rehab goals. ? ?Laureen Abrahams, PT, DPT ?10/02/21 9:56 AM ? ? ?

## 2021-10-09 ENCOUNTER — Encounter: Payer: Medicare Other | Admitting: Physical Therapy

## 2021-12-14 ENCOUNTER — Other Ambulatory Visit: Payer: Self-pay | Admitting: Internal Medicine

## 2021-12-14 DIAGNOSIS — Z1231 Encounter for screening mammogram for malignant neoplasm of breast: Secondary | ICD-10-CM

## 2021-12-15 ENCOUNTER — Ambulatory Visit
Admission: RE | Admit: 2021-12-15 | Discharge: 2021-12-15 | Disposition: A | Discharge status: Home / Self Care | Payer: Medicare Other | Source: Ambulatory Visit | Attending: Internal Medicine | Admitting: Internal Medicine

## 2021-12-15 DIAGNOSIS — Z1231 Encounter for screening mammogram for malignant neoplasm of breast: Secondary | ICD-10-CM | POA: Diagnosis not present

## 2021-12-20 DIAGNOSIS — B0229 Other postherpetic nervous system involvement: Secondary | ICD-10-CM | POA: Diagnosis not present

## 2021-12-20 DIAGNOSIS — E785 Hyperlipidemia, unspecified: Secondary | ICD-10-CM | POA: Diagnosis not present

## 2021-12-20 DIAGNOSIS — K219 Gastro-esophageal reflux disease without esophagitis: Secondary | ICD-10-CM | POA: Diagnosis not present

## 2021-12-20 DIAGNOSIS — G4700 Insomnia, unspecified: Secondary | ICD-10-CM | POA: Diagnosis not present

## 2021-12-20 DIAGNOSIS — J309 Allergic rhinitis, unspecified: Secondary | ICD-10-CM | POA: Diagnosis not present

## 2021-12-20 DIAGNOSIS — E559 Vitamin D deficiency, unspecified: Secondary | ICD-10-CM | POA: Diagnosis not present

## 2021-12-20 DIAGNOSIS — R7309 Other abnormal glucose: Secondary | ICD-10-CM | POA: Diagnosis not present

## 2021-12-20 DIAGNOSIS — Z0000 Encounter for general adult medical examination without abnormal findings: Secondary | ICD-10-CM | POA: Diagnosis not present

## 2022-03-02 DIAGNOSIS — H35363 Drusen (degenerative) of macula, bilateral: Secondary | ICD-10-CM | POA: Diagnosis not present

## 2022-03-02 DIAGNOSIS — H524 Presbyopia: Secondary | ICD-10-CM | POA: Diagnosis not present

## 2022-03-02 DIAGNOSIS — H25813 Combined forms of age-related cataract, bilateral: Secondary | ICD-10-CM | POA: Diagnosis not present

## 2022-03-02 DIAGNOSIS — D3131 Benign neoplasm of right choroid: Secondary | ICD-10-CM | POA: Diagnosis not present

## 2022-03-02 DIAGNOSIS — H353131 Nonexudative age-related macular degeneration, bilateral, early dry stage: Secondary | ICD-10-CM | POA: Diagnosis not present

## 2022-03-02 DIAGNOSIS — H04123 Dry eye syndrome of bilateral lacrimal glands: Secondary | ICD-10-CM | POA: Diagnosis not present

## 2022-06-05 ENCOUNTER — Ambulatory Visit (HOSPITAL_COMMUNITY)
Admission: EM | Admit: 2022-06-05 | Discharge: 2022-06-05 | Disposition: A | Discharge status: Home / Self Care | Payer: Medicare Other | Attending: Emergency Medicine | Admitting: Emergency Medicine

## 2022-06-05 ENCOUNTER — Encounter (HOSPITAL_COMMUNITY): Payer: Self-pay

## 2022-06-05 ENCOUNTER — Ambulatory Visit (INDEPENDENT_AMBULATORY_CARE_PROVIDER_SITE_OTHER): Payer: Medicare Other

## 2022-06-05 DIAGNOSIS — R0602 Shortness of breath: Secondary | ICD-10-CM

## 2022-06-05 DIAGNOSIS — R062 Wheezing: Secondary | ICD-10-CM | POA: Diagnosis not present

## 2022-06-05 DIAGNOSIS — B349 Viral infection, unspecified: Secondary | ICD-10-CM | POA: Diagnosis not present

## 2022-06-05 DIAGNOSIS — R059 Cough, unspecified: Secondary | ICD-10-CM | POA: Diagnosis not present

## 2022-06-05 MED ORDER — ALBUTEROL SULFATE HFA 108 (90 BASE) MCG/ACT IN AERS: 2.0000 | INHALATION_SPRAY | Freq: Four times a day (QID) | RESPIRATORY_TRACT | 0 refills | Status: DC | PRN

## 2022-06-05 MED ORDER — BENZONATATE 100 MG PO CAPS: 100.0000 mg | ORAL_CAPSULE | Freq: Three times a day (TID) | ORAL | 0 refills | Status: DC

## 2022-06-05 MED ORDER — ALBUTEROL SULFATE (2.5 MG/3ML) 0.083% IN NEBU
2.5000 mg | INHALATION_SOLUTION | Freq: Once | RESPIRATORY_TRACT | Status: AC
Start: 2022-06-05 — End: 2022-06-05
  Administered 2022-06-05: 2.5 mg via RESPIRATORY_TRACT

## 2022-06-05 MED ORDER — IPRATROPIUM-ALBUTEROL 0.5-2.5 (3) MG/3ML IN SOLN
RESPIRATORY_TRACT | Status: AC
  Filled 2022-06-05: qty 3

## 2022-06-05 MED ORDER — ALBUTEROL SULFATE (2.5 MG/3ML) 0.083% IN NEBU
INHALATION_SOLUTION | RESPIRATORY_TRACT | Status: AC
  Filled 2022-06-05: qty 3

## 2022-06-05 NOTE — ED Provider Notes (Signed)
[place]    CSN: [id number] Arrival date & time: 06/05/22  8144      History   Chief Complaint Chief Complaint  Patient presents with   Cough   Sore Throat    HPI Brandy Leonard is a 76 y.o. female.  Patient presents complaining of productive cough with clear sputum, sore throat, chills, and headache x 2 days.  Patient reports a subjective fever at home. Patient reports chest congestion.  She states that her cough worsens at night.  She denies any known exposure to a viral illness.  She reports having wheezing and shortness of breath on exertion.  She denies any history of cardiopulmonary problems in the past.  She reports that she is taking over-the-counter medication for symptoms with no relief.  She reports a history of seasonal allergies, she takes over-the-counter medicines for these allergies.  She reports that this year she had to start taking an antihypertensive medication, she states that she is unaware of which medication it is, she states that she takes a low-dose of it.    Cough Associated symptoms: chills, fever, rhinorrhea, shortness of breath, sore throat and wheezing   Associated symptoms: no chest pain and no ear pain   Sore Throat Associated symptoms include shortness of breath. Pertinent negatives include no chest pain and no abdominal pain.    Past Medical History:  Diagnosis Date   Allergy    Benadryl, Flonase   GERD (gastroesophageal reflux disease)    Hemorrhoids    Insomnia    Lichen sclerosus of female genitalia     Patient Active Problem List   Diagnosis Date Noted   First degree hemorrhoids 81/85/6314   Lichen sclerosus of female genitalia 02/08/2015   Allergic rhinitis due to pollen 02/08/2015   Insomnia 02/26/2012    History reviewed. No pertinent surgical history.  OB History   No obstetric history on file.      Home Medications    Prior to Admission medications   Medication Sig Start Date End Date Taking?  Authorizing Provider  albuterol (VENTOLIN HFA) 108 (90 Base) MCG/ACT inhaler Inhale 2 puffs into the lungs every 6 (six) hours as needed for wheezing or shortness of breath. 06/05/22  Yes Flossie Dibble, NP  benzonatate (TESSALON) 100 MG capsule Take 1 capsule (100 mg total) by mouth every 8 (eight) hours. 06/05/22  Yes Flossie Dibble, NP  azelastine (ASTELIN) 0.1 % nasal spray Place 2 sprays 2 (two) times daily into both nostrils. Use in each nostril as directed 04/17/17   Wardell Honour, MD  cholecalciferol (VITAMIN D3) 25 MCG (1000 UNIT) tablet Take 1,000 Units by mouth daily.    [provider]  clobetasol cream (TEMOVATE) 9.70 % Apply 1 application topically 2 (two) times daily. 04/04/16   Wardell Honour, MD  diclofenac (VOLTAREN) 75 MG EC tablet TAKE 1 TABLET BY MOUTH TWICE A DAY BETWEEN MEALS AS NEEDED 08/03/21   Pete Pelt, PA-C  famotidine (PEPCID) 20 MG tablet Take 20 mg by mouth 2 (two) times daily. 09/12/19   [provider]  hydrocortisone (ANUSOL-HC) 2.5 % rectal cream Place 1 application 2 (two) times daily rectally. 04/17/17   Wardell Honour, MD  hydrocortisone (ANUSOL-HC) 25 MG suppository Place 1 suppository (25 mg total) 2 (two) times daily rectally. 04/17/17   Wardell Honour, MD  hydrocortisone 2.5 % ointment Apply 2 (two) times daily topically. 04/17/17   Wardell Honour, MD  levocetirizine Harlow Ohms) 5  MG tablet Take 1 tablet (5 mg total) by mouth every evening. 11/17/18   Wieters, Hallie C, PA-C  loratadine (CLARITIN) 10 MG tablet Take 1 tablet (10 mg total) by mouth daily. 11/17/18   Wieters, Hallie C, PA-C  methocarbamol (ROBAXIN) 500 MG tablet Take 1 tablet (500 mg total) by mouth every 6 (six) hours as needed for muscle spasms. 08/03/21   Pete Pelt, PA-C  traMADol (ULTRAM) 50 MG tablet TAKE 1 TO 2 TABLETS BY MOUTH EVERY 6 HOURS AS NEEDED 07/23/19   Mcarthur Rossetti, MD  traZODone (DESYREL) 50 MG tablet Take 1 tablet (50 mg total) by mouth  at bedtime. 11/17/18   Wieters, Hallie C, PA-C  zinc gluconate 50 MG tablet Take 50 mg by mouth daily.    [provider]    Family History Family History  Problem Relation Age of Onset   Hypertension Mother    Heart disease Mother    Thyroid disease Father    Cancer Father 7       Bladder cancer   Stroke Father 50       CVA   Hypertension Brother    Stroke Maternal Grandmother    Hypertension Maternal Grandmother    Cancer Maternal Grandfather    Hypertension Sister     Social History Social History   Tobacco Use   Smoking status: Former    Packs/day: 0.50    Years: 20.00    Total pack years: 10.00    Types: Cigarettes    Start date: 89    Quit date: 2000    Years since quitting: 24.0   Smokeless tobacco: Never  Vaping Use   Vaping Use: Never used  Substance Use Topics   Alcohol use: Yes    Comment: Occasionally    Drug use: No     Allergies   Prednisone   Review of Systems Review of Systems  Constitutional:  Positive for chills, fatigue and fever. Negative for activity change and appetite change.  HENT:  Positive for congestion, rhinorrhea, sinus pressure and sore throat. Negative for ear discharge, ear pain, postnasal drip, sinus pain and trouble swallowing.   Eyes: Negative.   Respiratory:  Positive for cough, shortness of breath and wheezing. Negative for choking, chest tightness and stridor.   Cardiovascular:  Negative for chest pain and palpitations.  Gastrointestinal:  Negative for abdominal pain, diarrhea, nausea and vomiting.     Physical Exam Triage Vital Signs ED Triage Vitals  Enc Vitals Group     BP 06/05/22 0829 103/71     Pulse Rate 06/05/22 0829 66     Resp 06/05/22 0829 16     Temp 06/05/22 0829 98.1 F (36.7 C)     Temp Source 06/05/22 0829 Oral     SpO2 06/05/22 0829 95 %     Weight --      Height --      Head Circumference --      Peak Flow --      Pain Score 06/05/22 0832 0     Pain Loc --      Pain Edu? --       Excl. in [place]? --    No data found.  Updated Vital Signs BP 103/71 (BP Location: Left Arm)   Pulse 66   Temp 98.1 F (36.7 C) (Oral)   Resp 16   SpO2 95%     Physical Exam Vitals and nursing note reviewed.  Constitutional:  Appearance: She is well-developed.  HENT:     Right Ear: Hearing, tympanic membrane, ear canal and external ear normal.     Left Ear: Hearing, tympanic membrane, ear canal and external ear normal.     Nose: Rhinorrhea present. No congestion. Rhinorrhea is clear.     Right Turbinates: Not swollen or pale.     Left Turbinates: Not enlarged, swollen or pale.     Right Sinus: No maxillary sinus tenderness or frontal sinus tenderness.     Left Sinus: No maxillary sinus tenderness or frontal sinus tenderness.     Mouth/Throat:     Mouth: Mucous membranes are moist.     Pharynx: Uvula midline. Posterior oropharyngeal erythema present. No pharyngeal swelling, oropharyngeal exudate or uvula swelling.     Tonsils: No tonsillar exudate or tonsillar abscesses. 0 on the right. 0 on the left.  Cardiovascular:     Rate and Rhythm: Normal rate and regular rhythm.     Heart sounds: Normal heart sounds, S1 normal and S2 normal.  Pulmonary:     Effort: Pulmonary effort is normal.     Breath sounds: Examination of the right-upper field reveals wheezing. Examination of the right-middle field reveals wheezing and rhonchi. Examination of the right-lower field reveals decreased breath sounds. Examination of the left-lower field reveals decreased breath sounds. Decreased breath sounds, wheezing and rhonchi present. No rales.  Neurological:     Mental Status: She is alert.      UC Treatments / Results  Labs (all labs ordered are listed, but only abnormal results are displayed) Labs Reviewed - No data to display  EKG   Radiology DG Chest 2 View  Result Date: 06/05/2022 CLINICAL DATA:  Cough. EXAM: CHEST - 2 VIEW COMPARISON:  April 04, 2015. FINDINGS: The heart size  and mediastinal contours are within normal limits. Both lungs are clear. The visualized skeletal structures are unremarkable. IMPRESSION: No active cardiopulmonary disease. Electronically Signed   By: Marijo Conception M.D.   On: 06/05/2022 09:49    Procedures Procedures (including critical care time)  Medications Ordered in UC Medications  albuterol (PROVENTIL) (2.5 MG/3ML) 0.083% nebulizer solution 2.5 mg (2.5 mg Nebulization Given 06/05/22 0919)    Initial Impression / Assessment and Plan / UC Course  I have reviewed the triage vital signs and the nursing notes.  Pertinent labs & imaging results that were available during my care of the patient were reviewed by me and considered in my medical decision making (see chart for details).     Patient was evaluated for viral illness.  Chest x-ray was normal and showed no cardiopulmonary disease.  Breathing treatment provided the patient with relief of wheezing and shortness of breath.  Etiology of symptoms seems to be related to a viral cause.  Albuterol and Tessalon was sent to the pharmacy.  Patient was made aware of treatment regiment.  Patient declined any COVID testing.  Patient was made aware of timeline for resolution of symptoms when follow-up be necessary.  Patient verbalized understanding of instructions.  Charting was provided using a a verbal dictation system, charting was proofread for errors, errors may occur which could change the meaning of the information charted.   Final Clinical Impressions(s) / UC Diagnoses   Final diagnoses:  Viral illness     Discharge Instructions      Lavella Lemons has been sent to the pharmacy for cough, you can use this medication every 8 hours as needed. Albuterol has been sent to the pharmacy,  you can use this every 6 hours as needed, use 2 puffs at a time.  As discussed, please make sure to stay adequately hydrated at home and eat nutrient dense meals.   If your symptoms are not improving please  present for follow-up.     ED Prescriptions     Medication Sig Dispense Auth. Provider   benzonatate (TESSALON) 100 MG capsule Take 1 capsule (100 mg total) by mouth every 8 (eight) hours. 28 capsule Flossie Dibble, NP   albuterol (VENTOLIN HFA) 108 (90 Base) MCG/ACT inhaler Inhale 2 puffs into the lungs every 6 (six) hours as needed for wheezing or shortness of breath. 6.7 g Flossie Dibble, NP      PDMP not reviewed this encounter.   Flossie Dibble, NP 06/05/22 1214

## 2022-06-05 NOTE — ED Triage Notes (Signed)
Pt is here for cough, sneezing, congestion , sore throat ,chills, headache x 2 days

## 2022-06-05 NOTE — Discharge Instructions (Addendum)
Tessalon has been sent to the pharmacy for cough, you can use this medication every 8 hours as needed. Albuterol has been sent to the pharmacy, you can use this every 6 hours as needed, use 2 puffs at a time.  As discussed, please make sure to stay adequately hydrated at home and eat nutrient dense meals.   If your symptoms are not improving please present for follow-up.

## 2022-06-15 IMAGING — DX DG HIP (WITH OR WITHOUT PELVIS) 2-3V*R*
3 series · 3 of 3 positions shown · non-contrast
Comparison: None.

CLINICAL DATA: Status post fall yesterday, unable to walk.

EXAM:
DG HIP (WITH OR WITHOUT PELVIS) 2-3V RIGHT

[pelvis ap]
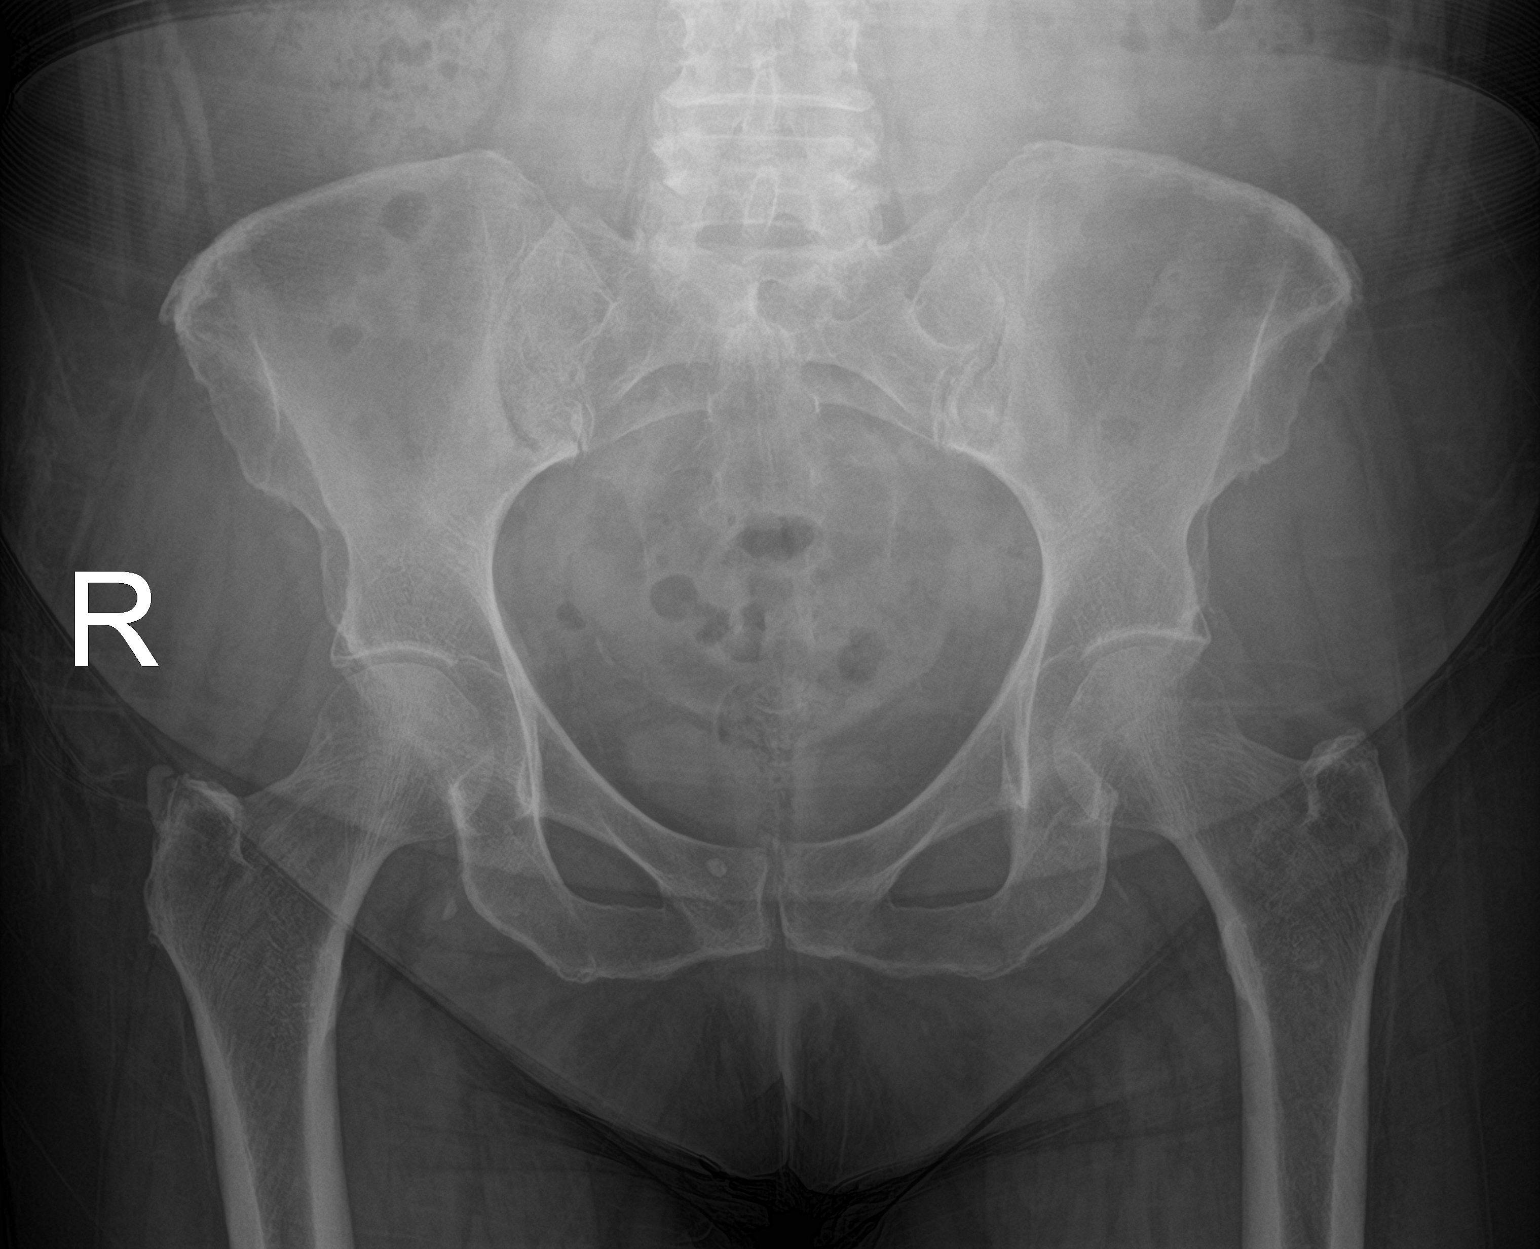

[hip ap]
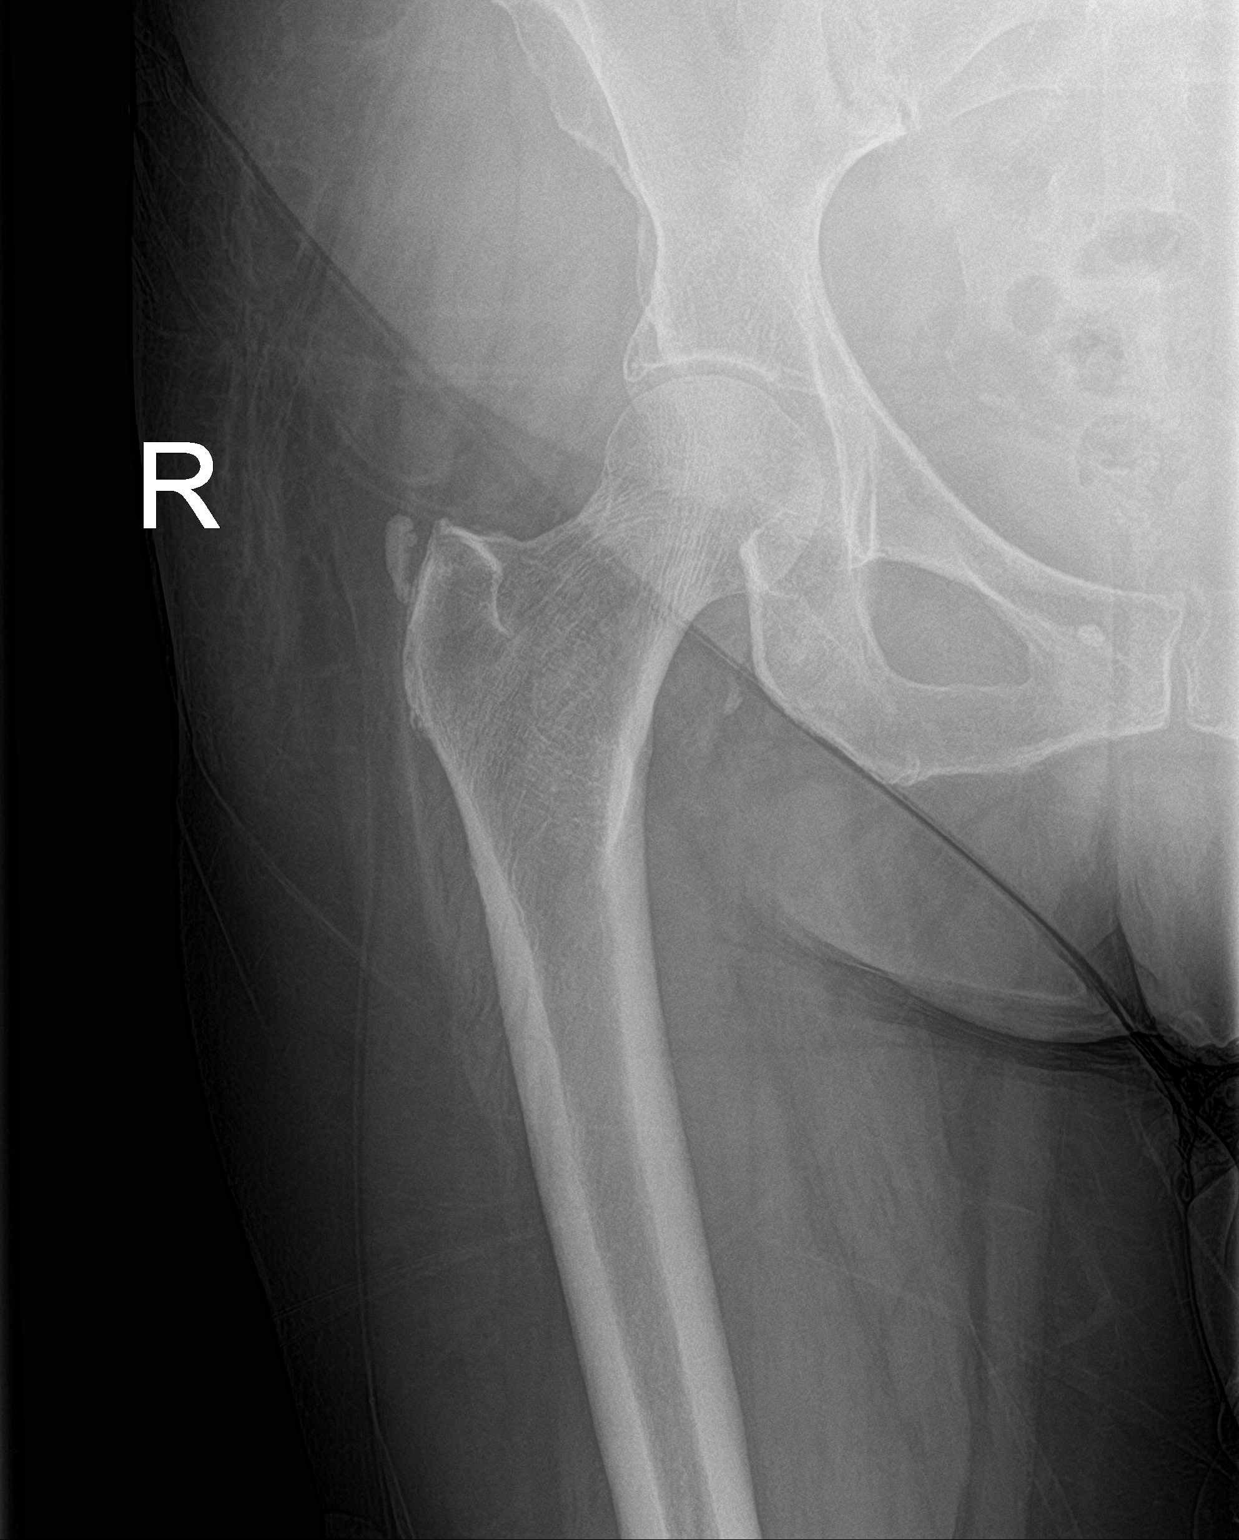

[hip lat]
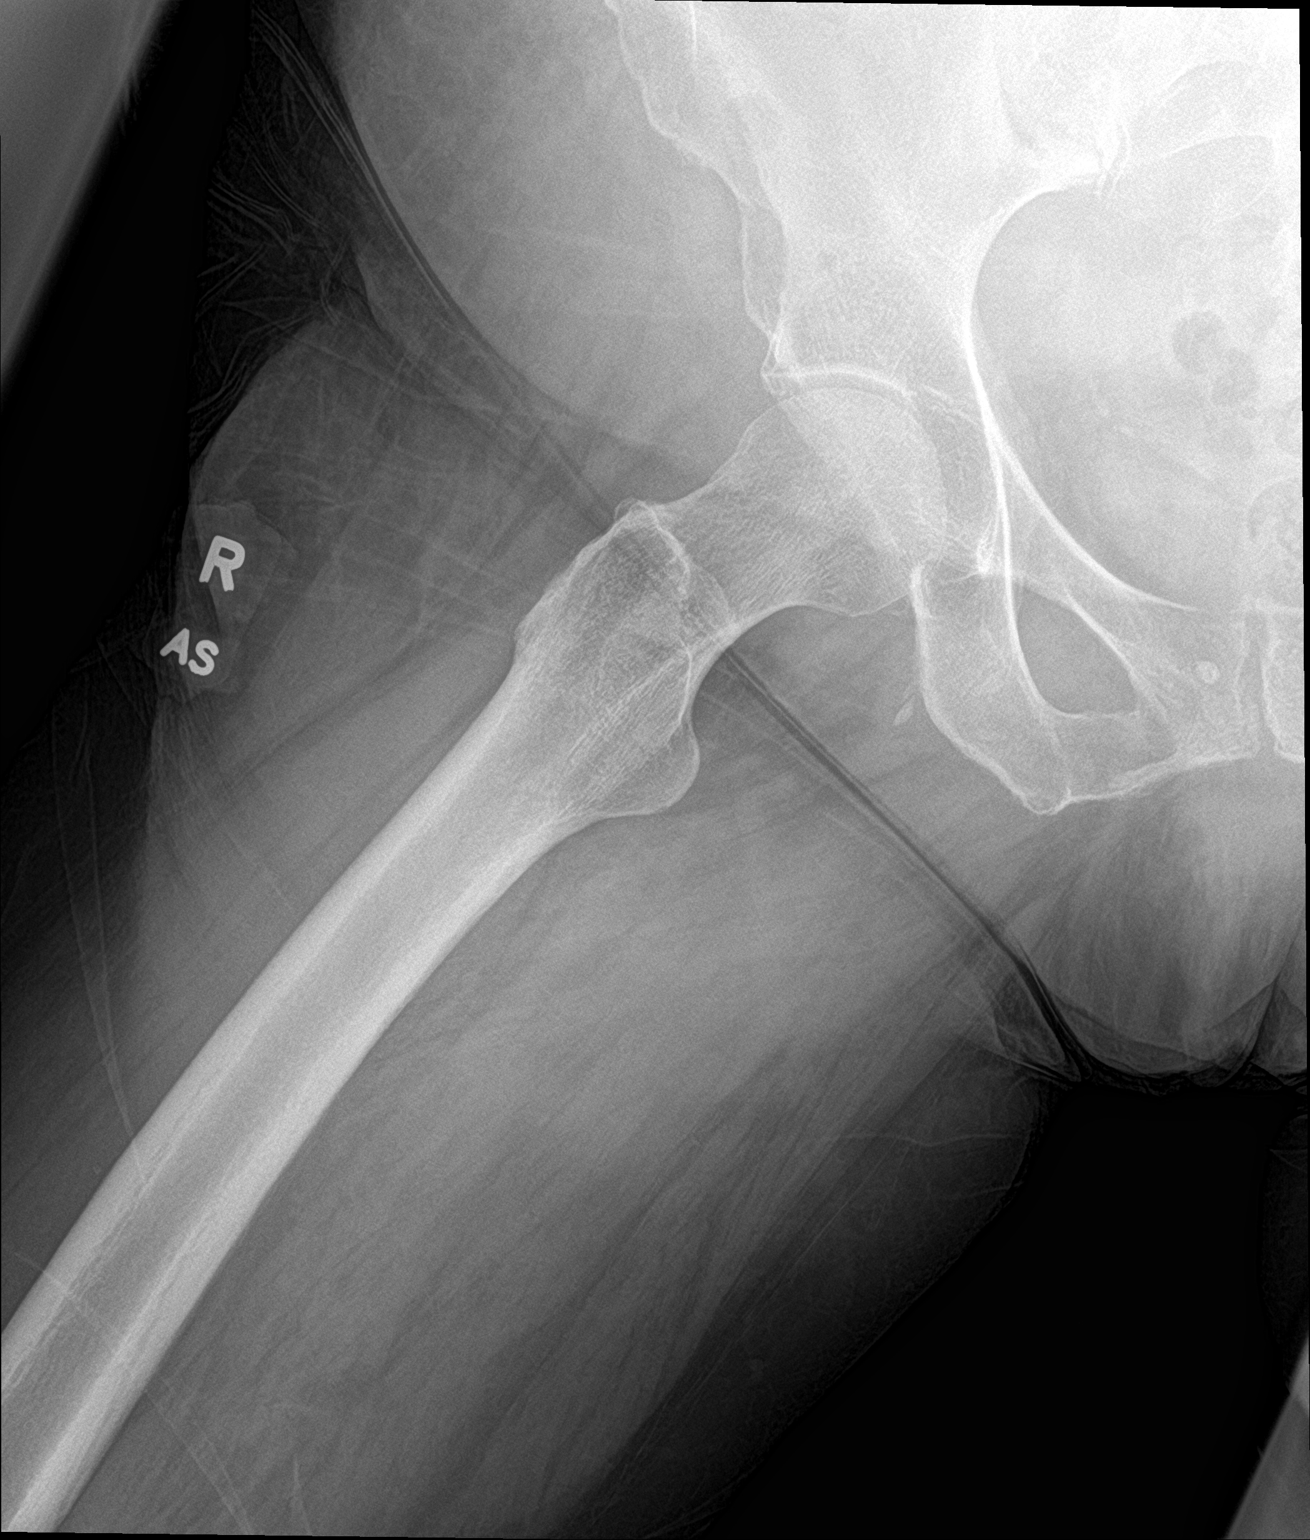

[3 of 3 positions shown; findings below may reference images not displayed]

FINDINGS: There is no evidence of hip fracture or dislocation. Mild narrow
bilateral hip joint spaces and degenerative joint changes of the
lower lumbar spine are noted.
IMPRESSION: No acute fracture or dislocation.

## 2022-09-17 ENCOUNTER — Other Ambulatory Visit: Payer: Self-pay

## 2022-09-17 ENCOUNTER — Ambulatory Visit (HOSPITAL_COMMUNITY)
Admission: EM | Admit: 2022-09-17 | Discharge: 2022-09-17 | Disposition: A | Discharge status: Home / Self Care | Payer: Medicare Other | Attending: Emergency Medicine | Admitting: Emergency Medicine

## 2022-09-17 ENCOUNTER — Encounter (HOSPITAL_COMMUNITY): Payer: Self-pay | Admitting: Emergency Medicine

## 2022-09-17 DIAGNOSIS — J069 Acute upper respiratory infection, unspecified: Secondary | ICD-10-CM

## 2022-09-17 DIAGNOSIS — R062 Wheezing: Secondary | ICD-10-CM | POA: Diagnosis not present

## 2022-09-17 MED ORDER — AMOXICILLIN 500 MG PO CAPS: 500.0000 mg | ORAL_CAPSULE | Freq: Two times a day (BID) | ORAL | 0 refills | Status: AC

## 2022-09-17 MED ORDER — ALBUTEROL SULFATE HFA 108 (90 BASE) MCG/ACT IN AERS: 2.0000 | INHALATION_SPRAY | Freq: Four times a day (QID) | RESPIRATORY_TRACT | 0 refills | Status: AC | PRN

## 2022-09-17 MED ORDER — BENZONATATE 100 MG PO CAPS: 100.0000 mg | ORAL_CAPSULE | Freq: Three times a day (TID) | ORAL | 0 refills | Status: AC

## 2022-09-17 NOTE — Discharge Instructions (Signed)
Begin use of amoxicillin every morning and every evening to provide coverage for bacteria, daily you will see improvement in about 48 hours  You may use albuterol inhaler taking 2 puffs every 4-6 hours as needed for wheezing  Use Tessalon Perles every 8 hours to help calm your cough    You can take Tylenol and/or Ibuprofen as needed for fever reduction and pain relief.   For cough: honey 1/2 to 1 teaspoon (you can dilute the honey in water or another fluid).  You can also use guaifenesin and dextromethorphan for cough. You can use a humidifier for chest congestion and cough.  If you don't have a humidifier, you can sit in the bathroom with the hot shower running.      For sore throat: try warm salt water gargles, cepacol lozenges, throat spray, warm tea or water with lemon/honey, popsicles or ice, or OTC cold relief medicine for throat discomfort.   For congestion: take a daily anti-histamine like Zyrtec, Claritin, and a oral decongestant, such as pseudoephedrine.  You can also use Flonase 1-2 sprays in each nostril daily.   It is important to stay hydrated: drink plenty of fluids (water, gatorade/powerade/pedialyte, juices, or teas) to keep your throat moisturized and help further relieve irritation/discomfort.

## 2022-09-17 NOTE — ED Provider Notes (Signed)
[facility]    CSN: [id number] Arrival date & time: 09/17/22  0801      History   Chief Complaint Chief Complaint  Patient presents with   Cough    HPI Brandy Leonard is a 77 y.o. female.   Patient presents for evaluation of nasal congestion, rhinorrhea, sore throat, cough, shortness of breath, wheeze, headache, body aches and fatigue present for 7 days.  Endorses cough has been present since a viral illness in December 2023, worsened over the last 7 days, is nonproductive but persistent.  Shortness of breath is only experienced after episodes of coughing and wheezing is predominantly at nighttime making it difficult to sleep.  Feels a sensation of mucus sitting in the throat that she is unable to move.  Last occurrence of diarrhea 1 day ago, stool described as soft.  Has been able to tolerate food and liquids.  No known sick contact prior.  Has attempted use of Aleve and Claritin which have been minimally effective.  History of seasonal allergies.    Past Medical History:  Diagnosis Date   Allergy    Benadryl, Flonase   GERD (gastroesophageal reflux disease)    Hemorrhoids    Insomnia    Lichen sclerosus of female genitalia     Patient Active Problem List   Diagnosis Date Noted   First degree hemorrhoids 02/08/2015   Lichen sclerosus of female genitalia 02/08/2015   Allergic rhinitis due to pollen 02/08/2015   Insomnia 02/26/2012    History reviewed. No pertinent surgical history.  OB History   No obstetric history on file.      Home Medications    Prior to Admission medications   Medication Sig Start Date End Date Taking? Authorizing Provider  albuterol (VENTOLIN HFA) 108 (90 Base) MCG/ACT inhaler Inhale 2 puffs into the lungs every 6 (six) hours as needed for wheezing or shortness of breath. 06/05/22   Debby FreibergWedderburn, Ngozi N, NP  azelastine (ASTELIN) 0.1 % nasal spray Place 2 sprays 2 (two) times daily into both nostrils. Use in each nostril as directed  04/17/17   Ethelda ChickSmith, Kristi M, MD  benzonatate (TESSALON) 100 MG capsule Take 1 capsule (100 mg total) by mouth every 8 (eight) hours. 06/05/22   Debby FreibergWedderburn, Ngozi N, NP  cholecalciferol (VITAMIN D3) 25 MCG (1000 UNIT) tablet Take 1,000 Units by mouth daily.    [provider]  clobetasol cream (TEMOVATE) 0.05 % Apply 1 application topically 2 (two) times daily. 04/04/16   Ethelda ChickSmith, Kristi M, MD  diclofenac (VOLTAREN) 75 MG EC tablet TAKE 1 TABLET BY MOUTH TWICE A DAY BETWEEN MEALS AS NEEDED 08/03/21   Kirtland Bouchardlark, Gilbert W, PA-C  famotidine (PEPCID) 20 MG tablet Take 20 mg by mouth 2 (two) times daily. 09/12/19   [provider]  hydrocortisone (ANUSOL-HC) 2.5 % rectal cream Place 1 application 2 (two) times daily rectally. 04/17/17   Ethelda ChickSmith, Kristi M, MD  hydrocortisone (ANUSOL-HC) 25 MG suppository Place 1 suppository (25 mg total) 2 (two) times daily rectally. 04/17/17   Ethelda ChickSmith, Kristi M, MD  hydrocortisone 2.5 % ointment Apply 2 (two) times daily topically. 04/17/17   Ethelda ChickSmith, Kristi M, MD  levocetirizine (XYZAL) 5 MG tablet Take 1 tablet (5 mg total) by mouth every evening. 11/17/18   Wieters, Hallie C, PA-C  loratadine (CLARITIN) 10 MG tablet Take 1 tablet (10 mg total) by mouth daily. 11/17/18   Wieters, Hallie C, PA-C  methocarbamol (ROBAXIN) 500 MG tablet Take 1 tablet (500 mg total)  by mouth every 6 (six) hours as needed for muscle spasms. 08/03/21   Kirtland Bouchard, PA-C  traMADol (ULTRAM) 50 MG tablet TAKE 1 TO 2 TABLETS BY MOUTH EVERY 6 HOURS AS NEEDED 07/23/19   Kathryne Hitch, MD  traZODone (DESYREL) 50 MG tablet Take 1 tablet (50 mg total) by mouth at bedtime. 11/17/18   Wieters, Hallie C, PA-C  zinc gluconate 50 MG tablet Take 50 mg by mouth daily.    [provider]    Family History Family History  Problem Relation Age of Onset   Hypertension Mother    Heart disease Mother    Thyroid disease Father    Cancer Father 51       Bladder cancer   Stroke Father 38        CVA   Hypertension Brother    Stroke Maternal Grandmother    Hypertension Maternal Grandmother    Cancer Maternal Grandfather    Hypertension Sister     Social History Social History   Tobacco Use   Smoking status: Former    Packs/day: 0.50    Years: 20.00    Additional pack years: 0.00    Total pack years: 10.00    Types: Cigarettes    Start date: 40    Quit date: 2000    Years since quitting: 24.2   Smokeless tobacco: Never  Vaping Use   Vaping Use: Never used  Substance Use Topics   Alcohol use: Yes    Comment: Occasionally    Drug use: No     Allergies   Prednisone   Review of Systems Review of Systems  Constitutional:  Positive for fatigue. Negative for activity change, appetite change, chills, diaphoresis, fever and unexpected weight change.  HENT:  Positive for congestion, rhinorrhea and sore throat. Negative for dental problem, drooling, ear discharge, ear pain, facial swelling, hearing loss, mouth sores, nosebleeds, postnasal drip, sinus pressure, sinus pain, sneezing, tinnitus, trouble swallowing and voice change.   Respiratory:  Positive for cough, shortness of breath and wheezing. Negative for apnea, choking, chest tightness and stridor.   Gastrointestinal:  Positive for diarrhea. Negative for abdominal distention, abdominal pain, anal bleeding, blood in stool, constipation, nausea, rectal pain and vomiting.  Musculoskeletal:  Positive for myalgias. Negative for arthralgias, back pain, gait problem, joint swelling, neck pain and neck stiffness.  Neurological:  Positive for headaches. Negative for dizziness, tremors, seizures, syncope, facial asymmetry, speech difficulty, weakness, light-headedness and numbness.     Physical Exam Triage Vital Signs ED Triage Vitals  Enc Vitals Group     BP 09/17/22 0813 (!) 148/90     Pulse Rate 09/17/22 0813 98     Resp 09/17/22 0813 18     Temp 09/17/22 0813 98.1 F (36.7 C)     Temp Source 09/17/22 0813 Oral      SpO2 09/17/22 0813 95 %     Weight --      Height --      Head Circumference --      Peak Flow --      Pain Score 09/17/22 0814 6     Pain Loc --      Pain Edu? --      Excl. in [place]? --    No data found.  Updated Vital Signs BP (!) 148/90 (BP Location: Left Arm)   Pulse 98   Temp 98.1 F (36.7 C) (Oral)   Resp 18   SpO2 95%   Visual Acuity  Right Eye Distance:   Left Eye Distance:   Bilateral Distance:    Right Eye Near:   Left Eye Near:    Bilateral Near:     Physical Exam Constitutional:      Appearance: Normal appearance.  HENT:     Head: Normocephalic.     Right Ear: Tympanic membrane, ear canal and external ear normal.     Left Ear: Tympanic membrane, ear canal and external ear normal.     Nose: Congestion and rhinorrhea present.     Mouth/Throat:     Mouth: Mucous membranes are moist.     Pharynx: No posterior oropharyngeal erythema.  Eyes:     Extraocular Movements: Extraocular movements intact.  Cardiovascular:     Rate and Rhythm: Normal rate and regular rhythm.     Pulses: Normal pulses.     Heart sounds: Normal heart sounds.  Pulmonary:     Effort: Pulmonary effort is normal.     Breath sounds: Normal breath sounds.  Skin:    General: Skin is warm and dry.  Neurological:     Mental Status: She is alert and oriented to person, place, and time. Mental status is at baseline.  Psychiatric:        Mood and Affect: Mood normal.        Behavior: Behavior normal.      UC Treatments / Results  Labs (all labs ordered are listed, but only abnormal results are displayed) Labs Reviewed - No data to display  EKG   Radiology No results found.  Procedures Procedures (including critical care time)  Medications Ordered in UC Medications - No data to display  Initial Impression / Assessment and Plan / UC Course  I have reviewed the triage vital signs and the nursing notes.  Pertinent labs & imaging results that were available during my care of the  patient were reviewed by me and considered in my medical decision making (see chart for details).  Acute upper respiratory infection, wheezing  The symptoms have persisted for 7 days with no signs of improvement we will provide bacterial coverage, amoxicillin prescribed as well as prednisone, albuterol and Tessalon for management of cough and wheezing, vitals are stable and she is in no signs of distress, low suspicion for pneumonia therefore imaging deferred, may additionally use over-the-counter medication for supportive care with follow-up with primary doctor or urgent care as needed if symptoms worsen Final Clinical Impressions(s) / UC Diagnoses   Final diagnoses:  None   Discharge Instructions   None    ED Prescriptions   None    PDMP not reviewed this encounter.   Valinda Hoar, [place] 09/17/22 [phone]

## 2022-09-17 NOTE — ED Triage Notes (Signed)
Patient presents to [facility] for evaluation of body aches, cough, fatigue.  Patient states she was sick around Christmas, had cough ever since, but in the past few days has started to have body aches, worsening cough, back pain, upset stomach, headache.

## 2023-01-29 ENCOUNTER — Other Ambulatory Visit: Payer: Self-pay | Admitting: Internal Medicine

## 2023-01-29 DIAGNOSIS — Z1231 Encounter for screening mammogram for malignant neoplasm of breast: Secondary | ICD-10-CM

## 2023-02-13 DIAGNOSIS — K219 Gastro-esophageal reflux disease without esophagitis: Secondary | ICD-10-CM | POA: Diagnosis not present

## 2023-02-13 DIAGNOSIS — E559 Vitamin D deficiency, unspecified: Secondary | ICD-10-CM | POA: Diagnosis not present

## 2023-02-13 DIAGNOSIS — J309 Allergic rhinitis, unspecified: Secondary | ICD-10-CM | POA: Diagnosis not present

## 2023-02-13 DIAGNOSIS — G4700 Insomnia, unspecified: Secondary | ICD-10-CM | POA: Diagnosis not present

## 2023-02-13 DIAGNOSIS — E785 Hyperlipidemia, unspecified: Secondary | ICD-10-CM | POA: Diagnosis not present

## 2023-02-13 DIAGNOSIS — M8580 Other specified disorders of bone density and structure, unspecified site: Secondary | ICD-10-CM | POA: Diagnosis not present

## 2023-02-13 DIAGNOSIS — Z1211 Encounter for screening for malignant neoplasm of colon: Secondary | ICD-10-CM | POA: Diagnosis not present

## 2023-02-13 DIAGNOSIS — Z1389 Encounter for screening for other disorder: Secondary | ICD-10-CM | POA: Diagnosis not present

## 2023-02-13 DIAGNOSIS — Z0000 Encounter for general adult medical examination without abnormal findings: Secondary | ICD-10-CM | POA: Diagnosis not present

## 2023-02-18 ENCOUNTER — Other Ambulatory Visit: Payer: Self-pay | Admitting: Internal Medicine

## 2023-02-18 DIAGNOSIS — M8580 Other specified disorders of bone density and structure, unspecified site: Secondary | ICD-10-CM

## 2023-02-20 ENCOUNTER — Ambulatory Visit
Admission: RE | Admit: 2023-02-20 | Discharge: 2023-02-20 | Disposition: A | Discharge status: Home / Self Care | Payer: Medicare Other | Source: Ambulatory Visit | Attending: Internal Medicine | Admitting: Internal Medicine

## 2023-02-20 DIAGNOSIS — Z1231 Encounter for screening mammogram for malignant neoplasm of breast: Secondary | ICD-10-CM | POA: Diagnosis not present

## 2023-03-07 DIAGNOSIS — K5730 Diverticulosis of large intestine without perforation or abscess without bleeding: Secondary | ICD-10-CM | POA: Diagnosis not present

## 2023-03-07 DIAGNOSIS — K625 Hemorrhage of anus and rectum: Secondary | ICD-10-CM | POA: Diagnosis not present

## 2023-03-07 DIAGNOSIS — K219 Gastro-esophageal reflux disease without esophagitis: Secondary | ICD-10-CM | POA: Diagnosis not present

## 2023-03-07 DIAGNOSIS — Z1211 Encounter for screening for malignant neoplasm of colon: Secondary | ICD-10-CM | POA: Diagnosis not present

## 2023-03-15 DIAGNOSIS — H35363 Drusen (degenerative) of macula, bilateral: Secondary | ICD-10-CM | POA: Diagnosis not present

## 2023-03-15 DIAGNOSIS — H25813 Combined forms of age-related cataract, bilateral: Secondary | ICD-10-CM | POA: Diagnosis not present

## 2023-03-15 DIAGNOSIS — H524 Presbyopia: Secondary | ICD-10-CM | POA: Diagnosis not present

## 2023-03-15 DIAGNOSIS — D3131 Benign neoplasm of right choroid: Secondary | ICD-10-CM | POA: Diagnosis not present

## 2023-03-15 DIAGNOSIS — H353131 Nonexudative age-related macular degeneration, bilateral, early dry stage: Secondary | ICD-10-CM | POA: Diagnosis not present

## 2023-03-30 DIAGNOSIS — Z23 Encounter for immunization: Secondary | ICD-10-CM | POA: Diagnosis not present

## 2023-05-20 ENCOUNTER — Ambulatory Visit: Payer: Medicare Other | Admitting: Podiatry

## 2023-05-20 ENCOUNTER — Encounter: Payer: Self-pay | Admitting: Podiatry

## 2023-05-20 ENCOUNTER — Ambulatory Visit (INDEPENDENT_AMBULATORY_CARE_PROVIDER_SITE_OTHER): Payer: Medicare Other

## 2023-05-20 VITALS — Ht 65.0 in | Wt 183.0 lb

## 2023-05-20 DIAGNOSIS — G5761 Lesion of plantar nerve, right lower limb: Secondary | ICD-10-CM

## 2023-05-20 DIAGNOSIS — M7751 Other enthesopathy of right foot: Secondary | ICD-10-CM

## 2023-05-20 MED ORDER — TRIAMCINOLONE ACETONIDE 10 MG/ML IJ SUSP
10.0000 mg | Freq: Once | INTRAMUSCULAR | Status: AC
Start: 2023-05-20 — End: 2023-05-20
  Administered 2023-05-20: 10 mg via INTRA_ARTICULAR

## 2023-05-21 NOTE — Progress Notes (Signed)
Subjective:   Patient ID: Brandy Leonard, female   DOB: 77 y.o.   MRN: [id number]   HPI Patient presents stating she has a lot of discomfort in the forefoot right and states that it has been hard to walk on.  He thinks maybe neuroma or maybe some kind of joint issue is here a number of years ago.  Patient does not smoke likes to be active   Review of Systems  All other systems reviewed and are negative.       Objective:  Physical Exam Vitals and nursing note reviewed.  Constitutional:      Appearance: She is well-developed.  Pulmonary:     Effort: Pulmonary effort is normal.  Musculoskeletal:        General: Normal range of motion.  Skin:    General: Skin is warm.  Neurological:     Mental Status: She is alert.     Neurovascular status intact muscle strength found to be adequate range of motion within normal limits with patient found to have a painful third metatarsal phalangeal joint left with fluid buildup mild discomfort in the interspace but pain mostly found to be within the joint surface itself.  Good digital perfusion well-oriented x 3     Assessment:  Inflammatory capsulitis third MPJ right fluid buildup around the joint surface     Plan:  H&P x-rays reviewed condition discussed went ahead did forefoot block aspirated the joint getting out a small amount of clear fluid and injected with half cc dexamethasone Kenalog applied thick plantar pad to offload weight discussed rigid bottom shoes stretching exercises and reappoint as symptoms indicate  X-rays indicate negative for signs of arthritis or fracture of the joint surface or bone

## 2023-07-29 DIAGNOSIS — K5520 Angiodysplasia of colon without hemorrhage: Secondary | ICD-10-CM | POA: Diagnosis not present

## 2023-07-29 DIAGNOSIS — K635 Polyp of colon: Secondary | ICD-10-CM | POA: Diagnosis not present

## 2023-07-29 DIAGNOSIS — D122 Benign neoplasm of ascending colon: Secondary | ICD-10-CM | POA: Diagnosis not present

## 2023-07-29 DIAGNOSIS — K5730 Diverticulosis of large intestine without perforation or abscess without bleeding: Secondary | ICD-10-CM | POA: Diagnosis not present

## 2023-07-29 DIAGNOSIS — Z1211 Encounter for screening for malignant neoplasm of colon: Secondary | ICD-10-CM | POA: Diagnosis not present

## 2023-08-22 DIAGNOSIS — J45909 Unspecified asthma, uncomplicated: Secondary | ICD-10-CM | POA: Diagnosis not present

## 2023-08-22 DIAGNOSIS — J309 Allergic rhinitis, unspecified: Secondary | ICD-10-CM | POA: Diagnosis not present

## 2023-10-10 ENCOUNTER — Other Ambulatory Visit: Payer: Medicare Other

## 2023-11-19 ENCOUNTER — Other Ambulatory Visit (HOSPITAL_BASED_OUTPATIENT_CLINIC_OR_DEPARTMENT_OTHER): Payer: Self-pay | Admitting: Internal Medicine

## 2023-11-19 DIAGNOSIS — M8580 Other specified disorders of bone density and structure, unspecified site: Secondary | ICD-10-CM

## 2023-11-20 ENCOUNTER — Other Ambulatory Visit

## 2024-01-09 DIAGNOSIS — J45909 Unspecified asthma, uncomplicated: Secondary | ICD-10-CM | POA: Diagnosis not present

## 2024-01-09 DIAGNOSIS — E785 Hyperlipidemia, unspecified: Secondary | ICD-10-CM | POA: Diagnosis not present

## 2024-01-13 ENCOUNTER — Encounter (HOSPITAL_COMMUNITY): Payer: Self-pay | Admitting: *Deleted

## 2024-01-13 ENCOUNTER — Other Ambulatory Visit: Payer: Self-pay

## 2024-01-13 ENCOUNTER — Ambulatory Visit (HOSPITAL_COMMUNITY)
Admission: EM | Admit: 2024-01-13 | Discharge: 2024-01-13 | Disposition: A | Discharge status: Home / Self Care | Attending: Emergency Medicine | Admitting: Emergency Medicine

## 2024-01-13 DIAGNOSIS — J029 Acute pharyngitis, unspecified: Secondary | ICD-10-CM | POA: Diagnosis not present

## 2024-01-13 DIAGNOSIS — J069 Acute upper respiratory infection, unspecified: Secondary | ICD-10-CM

## 2024-01-13 LAB — POC SARS CORONAVIRUS 2 AG -  ED: SARS Coronavirus 2 Ag: NEGATIVE

## 2024-01-13 MED ORDER — AZELASTINE HCL 0.1 % NA SOLN: 2.0000 | Freq: Two times a day (BID) | NASAL | 0 refills | Status: AC

## 2024-01-13 NOTE — ED Triage Notes (Signed)
 Pt reports over the weekend she has had a sore throat,cough and increased GERD . Pt has tried OTC with out relief.

## 2024-01-13 NOTE — Discharge Instructions (Signed)
 Continue your over the counter medicines to help manage your symptoms.   Most viral infections run their course in about 10-14 days.

## 2024-01-13 NOTE — ED Provider Notes (Signed)
 [facility]    CSN: [id number] Arrival date & time: 01/13/24  0801      History   Chief Complaint Chief Complaint  Patient presents with   Heartburn   Cough   Sore Throat    HPI Brandy Leonard is a 78 y.o. female.   Pt with sore throat, dry cough, post nasal drainage, worse heartburn that usual for 2 days. Is using flonase , robitussin, claritin , alka seltzer, and throat lozenges to help relieve sx. Has not checked self for covid at home. Feels tired. Does not think she has a fever.    Heartburn  Cough Sore Throat    Past Medical History:  Diagnosis Date   Allergy    Benadryl, Flonase    GERD (gastroesophageal reflux disease)    Hemorrhoids    Insomnia    Lichen sclerosus of female genitalia     Patient Active Problem List   Diagnosis Date Noted   First degree hemorrhoids 02/08/2015   Lichen sclerosus of female genitalia 02/08/2015   Allergic rhinitis due to pollen 02/08/2015   Insomnia 02/26/2012    History reviewed. No pertinent surgical history.  OB History   No obstetric history on file.      Home Medications    Prior to Admission medications   Medication Sig Start Date End Date Taking? Authorizing Provider  cholecalciferol (VITAMIN D3) 25 MCG (1000 UNIT) tablet Take 1,000 Units by mouth daily.   Yes [provider]  famotidine (PEPCID) 20 MG tablet Take 20 mg by mouth 2 (two) times daily. 09/12/19  Yes [provider]  hydrocortisone  (ANUSOL -HC) 2.5 % rectal cream Place 1 application 2 (two) times daily rectally. 04/17/17  Yes Smith, Kristi M, MD  hydrocortisone  (ANUSOL -HC) 25 MG suppository Place 1 suppository (25 mg total) 2 (two) times daily rectally. 04/17/17  Yes Claudene Rayfield HERO, MD  loratadine  (CLARITIN ) 10 MG tablet Take 1 tablet (10 mg total) by mouth daily. 11/17/18  Yes Wieters, Hallie C, PA-C  traZODone  (DESYREL ) 50 MG tablet Take 1 tablet (50 mg total) by mouth at bedtime. 11/17/18  Yes Wieters, Hallie C, PA-C   zinc gluconate 50 MG tablet Take 50 mg by mouth daily.   Yes [provider]  albuterol  (VENTOLIN  HFA) 108 (90 Base) MCG/ACT inhaler Inhale 2 puffs into the lungs every 6 (six) hours as needed for wheezing or shortness of breath. 09/17/22   White, Shelba SAUNDERS, NP  azelastine  (ASTELIN ) 0.1 % nasal spray Place 2 sprays into both nostrils 2 (two) times daily. Use in each nostril as directed 01/13/24   Richad Jon HERO, NP  benzonatate  (TESSALON ) 100 MG capsule Take 1 capsule (100 mg total) by mouth every 8 (eight) hours. 09/17/22   Teresa Shelba SAUNDERS, NP  clobetasol  cream (TEMOVATE ) 0.05 % Apply 1 application topically 2 (two) times daily. 04/04/16   Claudene Rayfield HERO, MD  diclofenac  (VOLTAREN ) 75 MG EC tablet TAKE 1 TABLET BY MOUTH TWICE A DAY BETWEEN MEALS AS NEEDED 08/03/21   Gretta Bertrum ORN, PA-C  hydrocortisone  2.5 % ointment Apply 2 (two) times daily topically. 04/17/17   Smith, Kristi M, MD  levocetirizine (XYZAL ) 5 MG tablet Take 1 tablet (5 mg total) by mouth every evening. 11/17/18   Wieters, Hallie C, PA-C  methocarbamol  (ROBAXIN ) 500 MG tablet Take 1 tablet (500 mg total) by mouth every 6 (six) hours as needed for muscle spasms. 08/03/21   Gretta Bertrum ORN, PA-C  traMADol  (ULTRAM ) 50 MG tablet TAKE 1 TO  2 TABLETS BY MOUTH EVERY 6 HOURS AS NEEDED 07/23/19   Vernetta Lonni GRADE, MD    Family History Family History  Problem Relation Age of Onset   Hypertension Mother    Heart disease Mother    Thyroid  disease Father    Cancer Father 30       Bladder cancer   Stroke Father 61       CVA   Hypertension Brother    Stroke Maternal Grandmother    Hypertension Maternal Grandmother    Cancer Maternal Grandfather    Hypertension Sister     Social History Social History   Tobacco Use   Smoking status: Former    Current packs/day: 0.00    Average packs/day: 0.5 packs/day for 20.0 years (10.0 ttl pk-yrs)    Types: Cigarettes    Start date: 72    Quit date: 2000    Years since  quitting: 25.6   Smokeless tobacco: Never  Vaping Use   Vaping status: Never Used  Substance Use Topics   Alcohol use: Yes    Comment: Occasionally    Drug use: No     Allergies   Prednisone   Review of Systems Review of Systems  Respiratory:  Positive for cough.   Gastrointestinal:  Positive for heartburn.     Physical Exam Triage Vital Signs ED Triage Vitals [01/13/24 0821]  Encounter Vitals Group     BP 119/78     Girls Systolic BP Percentile      Girls Diastolic BP Percentile      Boys Systolic BP Percentile      Boys Diastolic BP Percentile      Pulse Rate 100     Resp 18     Temp 98.1 F (36.7 C)     Temp src      SpO2 93 %     Weight      Height      Head Circumference      Peak Flow      Pain Score      Pain Loc      Pain Education      Exclude from Growth Chart    No data found.  Updated Vital Signs BP 119/78   Pulse 100   Temp 98.1 F (36.7 C)   Resp 18   SpO2 93%   Visual Acuity Right Eye Distance:   Left Eye Distance:   Bilateral Distance:    Right Eye Near:   Left Eye Near:    Bilateral Near:     Physical Exam Constitutional:      General: She is not in acute distress.    Appearance: She is well-developed. She is not ill-appearing.  HENT:     Right Ear: Tympanic membrane and ear canal normal.     Left Ear: Tympanic membrane and ear canal normal.     Nose: No congestion or rhinorrhea.     Mouth/Throat:     Mouth: Mucous membranes are moist.     Pharynx: Oropharynx is clear. No oropharyngeal exudate or posterior oropharyngeal erythema.  Cardiovascular:     Rate and Rhythm: Normal rate and regular rhythm.  Pulmonary:     Effort: Pulmonary effort is normal.     Breath sounds: Normal breath sounds.     Comments: Occasional dry cough Lymphadenopathy:     Head:     Right side of head: No submandibular adenopathy.     Left side of head: No submandibular adenopathy.  Neurological:     Mental Status: She is alert.      UC  Treatments / Results  Labs (all labs ordered are listed, but only abnormal results are displayed) Labs Reviewed  POC SARS CORONAVIRUS 2 AG -  ED    EKG   Radiology No results found.  Procedures Procedures (including critical care time)  Medications Ordered in UC Medications - No data to display  Initial Impression / Assessment and Plan / UC Course  I have reviewed the triage vital signs and the nursing notes.  Pertinent labs & imaging results that were available during my care of the patient were reviewed by me and considered in my medical decision making (see chart for details).    COVID negative. Likely viral uri. Discussed supportive care measures.   Final Clinical Impressions(s) / UC Diagnoses   Final diagnoses:  Sore throat  Viral upper respiratory tract infection     Discharge Instructions      Continue your over the counter medicines to help manage your symptoms.   Most viral infections run their course in about 10-14 days.     ED Prescriptions     Medication Sig Dispense Auth. Provider   azelastine  (ASTELIN ) 0.1 % nasal spray Place 2 sprays into both nostrils 2 (two) times daily. Use in each nostril as directed 30 mL Aracelys Glade, Jon HERO, NP      PDMP not reviewed this encounter.   Richad Jon HERO, NP 01/13/24 [phone]

## 2024-02-04 ENCOUNTER — Other Ambulatory Visit: Payer: Self-pay | Admitting: Emergency Medicine

## 2024-02-09 DIAGNOSIS — J45909 Unspecified asthma, uncomplicated: Secondary | ICD-10-CM | POA: Diagnosis not present

## 2024-02-09 DIAGNOSIS — E785 Hyperlipidemia, unspecified: Secondary | ICD-10-CM | POA: Diagnosis not present

## 2024-02-17 DIAGNOSIS — J309 Allergic rhinitis, unspecified: Secondary | ICD-10-CM | POA: Diagnosis not present

## 2024-02-17 DIAGNOSIS — E559 Vitamin D deficiency, unspecified: Secondary | ICD-10-CM | POA: Diagnosis not present

## 2024-02-17 DIAGNOSIS — Z0000 Encounter for general adult medical examination without abnormal findings: Secondary | ICD-10-CM | POA: Diagnosis not present

## 2024-02-17 DIAGNOSIS — M8580 Other specified disorders of bone density and structure, unspecified site: Secondary | ICD-10-CM | POA: Diagnosis not present

## 2024-02-17 DIAGNOSIS — E785 Hyperlipidemia, unspecified: Secondary | ICD-10-CM | POA: Diagnosis not present

## 2024-02-17 DIAGNOSIS — G4700 Insomnia, unspecified: Secondary | ICD-10-CM | POA: Diagnosis not present

## 2024-02-17 DIAGNOSIS — Z23 Encounter for immunization: Secondary | ICD-10-CM | POA: Diagnosis not present

## 2024-02-17 DIAGNOSIS — K219 Gastro-esophageal reflux disease without esophagitis: Secondary | ICD-10-CM | POA: Diagnosis not present

## 2024-03-10 DIAGNOSIS — J45909 Unspecified asthma, uncomplicated: Secondary | ICD-10-CM | POA: Diagnosis not present

## 2024-03-10 DIAGNOSIS — E785 Hyperlipidemia, unspecified: Secondary | ICD-10-CM | POA: Diagnosis not present

## 2024-03-12 ENCOUNTER — Other Ambulatory Visit: Payer: Self-pay | Admitting: Internal Medicine

## 2024-03-12 DIAGNOSIS — Z1231 Encounter for screening mammogram for malignant neoplasm of breast: Secondary | ICD-10-CM

## 2024-03-14 DIAGNOSIS — Z23 Encounter for immunization: Secondary | ICD-10-CM | POA: Diagnosis not present

## 2024-03-16 DIAGNOSIS — D3131 Benign neoplasm of right choroid: Secondary | ICD-10-CM | POA: Diagnosis not present

## 2024-03-16 DIAGNOSIS — H353131 Nonexudative age-related macular degeneration, bilateral, early dry stage: Secondary | ICD-10-CM | POA: Diagnosis not present

## 2024-03-16 DIAGNOSIS — H35363 Drusen (degenerative) of macula, bilateral: Secondary | ICD-10-CM | POA: Diagnosis not present

## 2024-03-16 DIAGNOSIS — H25813 Combined forms of age-related cataract, bilateral: Secondary | ICD-10-CM | POA: Diagnosis not present

## 2024-03-16 DIAGNOSIS — H524 Presbyopia: Secondary | ICD-10-CM | POA: Diagnosis not present

## 2024-03-18 ENCOUNTER — Ambulatory Visit

## 2024-03-19 ENCOUNTER — Inpatient Hospital Stay: Admission: RE | Admit: 2024-03-19 | Discharge: 2024-03-19 | Attending: Internal Medicine | Admitting: Internal Medicine

## 2024-03-19 DIAGNOSIS — Z1231 Encounter for screening mammogram for malignant neoplasm of breast: Secondary | ICD-10-CM

## 2024-07-22 ENCOUNTER — Other Ambulatory Visit (HOSPITAL_BASED_OUTPATIENT_CLINIC_OR_DEPARTMENT_OTHER)
